# Patient Record
Sex: Male | Born: 1965 | Race: Black or African American | Hispanic: No | Marital: Married | Smoking: Never smoker
Health system: Southern US, Community
[De-identification: ages and names within clinical notes are randomized; demographics above are authoritative.]

## PROBLEM LIST (undated history)

## (undated) DIAGNOSIS — R7303 Prediabetes: Secondary | ICD-10-CM

## (undated) DIAGNOSIS — Z789 Other specified health status: Secondary | ICD-10-CM

## (undated) DIAGNOSIS — E785 Hyperlipidemia, unspecified: Secondary | ICD-10-CM

## (undated) HISTORY — PX: NO PAST SURGERIES: SHX2092

## (undated) HISTORY — DX: Prediabetes: R73.03

## (undated) HISTORY — DX: Hyperlipidemia, unspecified: E78.5

---

## 2005-08-25 ENCOUNTER — Ambulatory Visit: Payer: Self-pay | Admitting: General Practice

## 2011-10-03 ENCOUNTER — Ambulatory Visit: Payer: Self-pay | Admitting: General Practice

## 2011-11-01 ENCOUNTER — Ambulatory Visit: Payer: Self-pay | Admitting: General Practice

## 2014-09-23 ENCOUNTER — Ambulatory Visit: Payer: Self-pay | Admitting: General Practice

## 2015-12-29 ENCOUNTER — Emergency Department
Admission: EM | Admit: 2015-12-29 | Discharge: 2015-12-29 | Disposition: A | Discharge status: Home / Self Care | Payer: Worker's Compensation | Attending: Emergency Medicine | Admitting: Emergency Medicine

## 2015-12-29 ENCOUNTER — Encounter: Payer: Self-pay | Admitting: Emergency Medicine

## 2015-12-29 ENCOUNTER — Emergency Department: Payer: Worker's Compensation

## 2015-12-29 ENCOUNTER — Other Ambulatory Visit: Payer: Self-pay | Admitting: Physician Assistant

## 2015-12-29 DIAGNOSIS — Y92310 Basketball court as the place of occurrence of the external cause: Secondary | ICD-10-CM | POA: Diagnosis not present

## 2015-12-29 DIAGNOSIS — Y998 Other external cause status: Secondary | ICD-10-CM | POA: Insufficient documentation

## 2015-12-29 DIAGNOSIS — S8992XA Unspecified injury of left lower leg, initial encounter: Secondary | ICD-10-CM | POA: Diagnosis present

## 2015-12-29 DIAGNOSIS — M25462 Effusion, left knee: Secondary | ICD-10-CM | POA: Insufficient documentation

## 2015-12-29 DIAGNOSIS — S86812A Strain of other muscle(s) and tendon(s) at lower leg level, left leg, initial encounter: Secondary | ICD-10-CM | POA: Diagnosis not present

## 2015-12-29 DIAGNOSIS — Y9367 Activity, basketball: Secondary | ICD-10-CM | POA: Insufficient documentation

## 2015-12-29 DIAGNOSIS — W51XXXA Accidental striking against or bumped into by another person, initial encounter: Secondary | ICD-10-CM | POA: Insufficient documentation

## 2015-12-29 MED ORDER — IBUPROFEN 600 MG PO TABS: 600.0000 mg | ORAL_TABLET | Freq: Three times a day (TID) | ORAL | Status: DC | PRN

## 2015-12-29 MED ORDER — OXYCODONE-ACETAMINOPHEN 7.5-325 MG PO TABS: 1.0000 | ORAL_TABLET | ORAL | Status: AC | PRN

## 2015-12-29 NOTE — ED Provider Notes (Signed)
[facility] Emergency Department Provider Note     Time seen: ----------------------------------------- 1:30 PM on 12/29/2015 -----------------------------------------    I have reviewed the triage vital signs and the nursing notes.   HISTORY  Chief Complaint Knee Injury    HPI Mark Rubio is a 50 y.o. male who presents to ER after he was playing basketball and another player and he struck knees together. Patient presents with obvious deformity to the knee and is brought in by EMS not being able to walk. Patient denies any other complaints, received some pain medicine in route currently doesn't have any pain.   History reviewed. No pertinent past medical history.  There are no active problems to display for this patient.   History reviewed. No pertinent past surgical history.  Allergies Review of patient's allergies indicates no known allergies.  Social History Social History  Substance Use Topics  . Smoking status: Never Smoker   . Smokeless tobacco: None  . Alcohol Use: No    Review of Systems Constitutional: Negative for fever. Musculoskeletal: Positive for left knee pain Skin: Negative for rash. Neurological: Negative for headaches, focal weakness or numbness.  ____________________________________________   PHYSICAL EXAM:  VITAL SIGNS: ED Triage Vitals  Enc Vitals Group     BP 12/29/15 1259 141/75 mmHg     Pulse Rate 12/29/15 1259 89     Resp 12/29/15 1259 16     Temp 12/29/15 1258 98.8 F (37.1 C)     Temp Source 12/29/15 1258 Oral     SpO2 12/29/15 1259 98 %     Weight 12/29/15 1258 250 lb (113.399 kg)     Height 12/29/15 1258 6\' 5"  (1.956 m)     Head Cir --      Peak Flow --      Pain Score 12/29/15 1258 5     Pain Loc --      Pain Edu? --      Excl. in [place]? --     Constitutional: Alert and oriented. Well appearing and in no distress. Musculoskeletal: Obvious left knee effusion with tenderness mostly over the  patella. Patella seems to be midline Neurologic:  Normal speech and language. No gross focal neurologic deficits are appreciated. Unable to assess gait due to pain and swelling Skin:  Skin is warm, dry and intact. No rash noted. ____________________________________________  ED COURSE:  Pertinent labs & imaging results that were available during my care of the patient were reviewed by me and considered in my medical decision making (see chart for details). Patient is in no acute distress, will obtain left knee x-rays. ____________________________________________  RADIOLOGY Images were viewed by me  Left knee x-rays Reveal patellar tendon injury. Patella is displaced superiorly. ____________________________________________  FINAL ASSESSMENT AND PLAN  Patellar tendon injury  Plan: Patient with imaging as dictated above. Patient with patellar tendon injury, I have discussed with orthopedist on call, he's been placed in a knee immobilizer and crutches and will have an MRI as an outpatient. I have discharged with pain medicine and outpatient instructions.   Earleen Newport, MD   Earleen Newport, MD 12/29/15 [phone]

## 2015-12-29 NOTE — ED Notes (Signed)
Pt was playing basketball and him and another player ran into eachother. Pt with obvious deformity to left knee, strong distal pulse, CBG 94.

## 2015-12-29 NOTE — Discharge Instructions (Signed)
Patellar Tendon Tear or Disruption With Rehab A patellar tendon tear or disruption is a complete tear of the tendon below the kneecap. The patellar tendon attaches the thigh muscle (quadriceps) to the shinbone (tibia). These muscles are responsible for bending the knee and flexing the hip. A tear in the patellar tendon results in a disability to perform these actions. SYMPTOMS   A "pop" or tear felt in the knee or under the kneecap, at the time of injury.  Pain, tenderness, swelling, warmth, or redness over and around the patellar tendon.  Pain that gets worse when trying to forcefully straighten the knee or bend the knee.  Inability to straighten the knee when seated.  Crackling sound (crepitation) when the tendon is moved or touched.  Bruising (contusion) around the knee within 48 hours of injury.  Loss of firm fullness when pushing on the area where the tendon ruptured (a defect between the ends of the tendon where they separated from each other). CAUSES  The patellar tendon tears when a force is placed on it that is greater than it can handle. Common causes of injury include:  A stressful incident, such as with jumping, hurdling, or starting a sprint.  Direct hit (trauma) to the knee. RISK INCREASES WITH:  Sports that require sudden, explosive muscle contraction, such as those involving jumping or quick starts.  Running or contact sports.  Poor strength and flexibility.  Previous patellar tendon injury.  Untreated patellar tendinitis.  Corticosteroid injection into the patellar tendon. (Corticosteroid injections weaken tendons.) PREVENTION  Warm up and stretch properly before activity.  Allow for adequate recovery between workouts.  Maintain physical fitness:  Strength, flexibility, and endurance.  Cardiovascular fitness.  Protect the knee with taping, protective strapping, or elastic compression bandage during activity. PROGNOSIS  If treated properly, patellar  tendon tears usually heal, with a return to sports within 6 to 9 months after injury.  RELATED COMPLICATIONS   Weakness of the thigh (quadriceps) muscles, especially if the tear is left untreated.  Re-rupture of the tendon after treatment.  Prolonged disability.  Risks of surgery: infection, injury to nerves (numbness, weakness, or paralysis), bleeding, knee stiffness, knee weakness, pain when sitting for long periods, pain when getting up from a seated position and when kneeling or squatting, pain going up or down stairs or hills, and knee giving way or buckling. TREATMENT  Treatment first involves resting from any activities that aggravate the symptoms. The use of ice and medicine will help reduce pain and inflammation. Applying a compression bandage and elevating the knee above the level of the heart will also help reduce inflammation. Definitive treatment for patellar tendon tears is surgery, because contraction of the quadriceps tendon prevents healing of the tendon. Surgery often involves using stitches (sutures) to sew the ends of the tendon back together. Surgery is followed by restraint of the knee, to allow for healing. After restraint, it is important to perform strengthening and stretching exercises to help regain strength and a full range of motion. These exercises may be completed at home or with a therapist.  MEDICATION   If pain medicine is needed, nonsteroidal anti-inflammatory medicines (aspirin and ibuprofen), or other minor pain relievers (acetaminophen), are often advised.  Do not take pain medicine for 7 days before surgery.  Prescription pain relievers may be given, if your caregiver thinks they are needed. Use only as directed and only as much as you need. COLD THERAPY  Cold treatment (icing) should be applied for 10 to 15  minutes every 2 to 3 hours for inflammation and pain, and immediately after activity that aggravates your symptoms. Use ice packs or an ice  massage. SEEK MEDICAL CARE IF:  Pain increases, despite treatment.  Cast discomfort develops.  Any of the following occur after surgery: signs of infection, including fever, increased pain, swelling, redness, drainage of fluids, or bleeding in the affected area.  New, unexplained symptoms develop. (Drugs used in treatment may produce side effects.) EXERCISES RANGE OF MOTION (ROM) AND STRETCHING EXERCISES - Patellar Tendon Tear/Disruption These exercises may help you when beginning to rehabilitate your injury. Your symptoms may resolve with or without further involvement from your physician, physical therapist or athletic trainer. While completing these exercises, remember:   Restoring tissue flexibility helps normal motion to return to the joints. This allows healthier, less painful movement and activity.  An effective stretch should be held for at least 30 seconds.  A stretch should never be painful. You should only feel a gentle lengthening or release in the stretched tissue. RANGE OF MOTION - Knee Flexion and Extension, Active-Assisted  Sit on the edge of a table or chair with your thighs firmly supported. It may be helpful to place a folded towel under the end of your right / left thigh.  Flexion (bending): Place the ankle of your healthy leg on top of the other ankle. Use your healthy leg to gently bend your right / left knee until you feel a mild tension across the top of your knee.  Hold for __________ seconds.  Extension (straightening): Switch your ankles so your right / left leg is on top. Use your healthy leg to straighten your right / left knee until you feel a mild tension on the backside of your knee.  Hold for __________ seconds. Repeat __________ times. Complete this exercise __________ times per day. RANGE OF MOTION - Knee Flexion, Active  Lie on your back with both knees straight. (If this causes back discomfort, bend your healthy knee, placing your foot flat on the  floor.)  Slowly slide your heel back toward your buttocks until you feel a gentle stretch in the front of your knee or thigh.  Hold for __________ seconds. Slowly slide your heel back to the starting position. Repeat __________ times. Complete this exercise __________ times per day.  STRENGTHENING EXERCISES - Patellar Tendon Tear/Disruption  These exercises may help you when beginning to rehabilitate your injury. They may resolve your symptoms with or without further involvement from your physician, physical therapist or athletic trainer. While completing these exercises, remember:   Muscles can gain both the endurance and the strength needed for everyday activities through controlled exercises.  Complete these exercises as instructed by your physician, physical therapist or athletic trainer. Increase the resistance and repetitions only as guided by your caregiver. STRENGTH - Quadriceps, Isometrics  Lie on your back with your right / left leg extended and your opposite knee bent.  Gradually tense the muscles in the front of your right / left thigh. You should see either your kneecap slide up toward your hip or increased dimpling just above the knee. This motion will push the back of the knee down toward the floor, mat, or bed on which you are lying.  Hold the muscle as tight as you can without increasing your pain for __________ seconds.  Relax the muscles slowly and completely between each repetition. Repeat __________ times. Complete this exercise __________ times per day.  STRENGTH - Quadriceps, Straight Leg Raises Quality counts!  Watch for signs that the quadriceps muscle is working, to be sure you are strengthening the correct muscles and not "cheating" by substituting with healthier muscles.  Lay on your back with your right / left leg extended and your opposite knee bent.  Tense the muscles in the front of your right / left thigh. You should see either your kneecap slide up or  increased dimpling just above the knee. Your thigh may even shake a bit.  Tighten these muscles even more and raise your leg 4 to 6 inches off the floor. Hold for __________ seconds.  Keeping these muscles tense, lower your leg.  Relax the muscles slowly and completely between each repetition. Repeat __________ times. Complete this exercise __________ times per day.  STRENGTH - Hip Abductors, Straight Leg Raises  Be aware of your form throughout the entire exercise, so that you exercise the correct muscles Poor form means that you are not strengthening the correct muscles.  Lie on your side so that your head, shoulders, knee and hip line up. You may bend your lower knee to help maintain your balance. Your right / left leg should be on top.  Roll your hips slightly forward, so that your hips are stacked directly over each other and your right / left knee is facing forward.  Lift your top leg up 4-6 inches, leading with your heel. Be sure that your foot does not drift forward or that your knee does not roll toward the ceiling.  Hold this position for __________ seconds. You should feel the muscles in your outer hip lifting. (You may not notice this until your leg begins to tire.)  Slowly lower your leg to the starting position. Allow the muscles to fully relax before beginning the next repetition. Repeat __________ times. Complete this exercise __________ times per day.  STRENGTH - Hip Extensors, Straight Leg Raises  Lie on your stomach on a firm surface.  Tense the muscles in your buttocks to lift your right / left leg about 4 inches. If you cannot lift your leg this high without arching your back, place a pillow under your hips.  Keep your knee straight. Hold __________ seconds.  Slowly lower your leg to the starting position and allow it to relax completely before starting the next repetition. Repeat __________ times. Complete this exercise __________ times per day.  STRENGTH - Hip  Adductors, Straight Leg Raises  Lie on your side so that your head, shoulders, knee and hip line up. You may place your upper foot in front, to help maintain your balance. Your right / left leg should be on the bottom.  Roll your hips slightly forward, so that your hips are stacked directly over each other and your right / left knee is facing forward.  Tense the muscles in your inner thigh and lift your bottom leg 4-6 inches. Hold this position for __________ seconds.  Slowly lower your leg to the starting position. Allow the muscles to fully relax before beginning the next repetition. Repeat __________ times. Complete this exercise __________ times per day.   This information is not intended to replace advice given to you by your health care provider. Make sure you discuss any questions you have with your health care provider.   Document Released: 10/10/2005 Document Revised: 02/24/2015 Document Reviewed: 01/22/2009 Elsevier Interactive Patient Education [employer].

## 2015-12-30 ENCOUNTER — Ambulatory Visit: Payer: BLUE CROSS/BLUE SHIELD

## 2015-12-31 ENCOUNTER — Ambulatory Visit
Admission: RE | Admit: 2015-12-31 | Discharge: 2015-12-31 | Disposition: A | Discharge status: Home / Self Care | Payer: Worker's Compensation | Source: Ambulatory Visit | Attending: Physician Assistant | Admitting: Physician Assistant

## 2015-12-31 DIAGNOSIS — S76112A Strain of left quadriceps muscle, fascia and tendon, initial encounter: Secondary | ICD-10-CM | POA: Diagnosis not present

## 2015-12-31 DIAGNOSIS — S86812A Strain of other muscle(s) and tendon(s) at lower leg level, left leg, initial encounter: Secondary | ICD-10-CM | POA: Diagnosis not present

## 2016-01-01 ENCOUNTER — Ambulatory Visit: Payer: Worker's Compensation

## 2016-01-01 ENCOUNTER — Ambulatory Visit
Admission: RE | Admit: 2016-01-01 | Discharge: 2016-01-01 | Disposition: A | Discharge status: Home / Self Care | Payer: Worker's Compensation | Source: Ambulatory Visit | Attending: Orthopedic Surgery | Admitting: Orthopedic Surgery

## 2016-01-01 ENCOUNTER — Encounter: Payer: Self-pay | Admitting: *Deleted

## 2016-01-01 ENCOUNTER — Ambulatory Visit: Payer: Worker's Compensation | Admitting: Anesthesiology

## 2016-01-01 ENCOUNTER — Encounter
Admission: RE | Disposition: A | Discharge status: Home / Self Care | Payer: Self-pay | Source: Ambulatory Visit | Attending: Orthopedic Surgery

## 2016-01-01 DIAGNOSIS — X509XXA Other and unspecified overexertion or strenuous movements or postures, initial encounter: Secondary | ICD-10-CM | POA: Diagnosis not present

## 2016-01-01 DIAGNOSIS — E7800 Pure hypercholesterolemia, unspecified: Secondary | ICD-10-CM | POA: Diagnosis not present

## 2016-01-01 DIAGNOSIS — Z79899 Other long term (current) drug therapy: Secondary | ICD-10-CM | POA: Diagnosis not present

## 2016-01-01 DIAGNOSIS — Y9367 Activity, basketball: Secondary | ICD-10-CM | POA: Insufficient documentation

## 2016-01-01 DIAGNOSIS — Z419 Encounter for procedure for purposes other than remedying health state, unspecified: Secondary | ICD-10-CM

## 2016-01-01 DIAGNOSIS — S76112A Strain of left quadriceps muscle, fascia and tendon, initial encounter: Secondary | ICD-10-CM | POA: Insufficient documentation

## 2016-01-01 HISTORY — PX: PATELLAR TENDON REPAIR: SHX737

## 2016-01-01 HISTORY — DX: Other specified health status: Z78.9

## 2016-01-01 SURGERY — REPAIR, TENDON, PATELLAR
Anesthesia: General | Site: Knee | Laterality: Left | Wound class: Clean

## 2016-01-01 MED ORDER — ONDANSETRON HCL 4 MG/2ML IJ SOLN
INTRAMUSCULAR | Status: AC
  Filled 2016-01-01: qty 2

## 2016-01-01 MED ORDER — FENTANYL CITRATE (PF) 100 MCG/2ML IJ SOLN
INTRAMUSCULAR | Status: AC
  Filled 2016-01-01: qty 2

## 2016-01-01 MED ORDER — CEFAZOLIN SODIUM-DEXTROSE 2-3 GM-% IV SOLR: 2.0000 g | Freq: Once | INTRAVENOUS | Status: DC

## 2016-01-01 MED ORDER — MIDAZOLAM HCL 2 MG/2ML IJ SOLN
INTRAMUSCULAR | Status: DC | PRN
  Administered 2016-01-01: 2 mg via INTRAVENOUS

## 2016-01-01 MED ORDER — LACTATED RINGERS IV SOLN
INTRAVENOUS | Status: DC
  Administered 2016-01-01: 125 mL/h via INTRAVENOUS
  Administered 2016-01-01: 10:00:00 via INTRAVENOUS

## 2016-01-01 MED ORDER — NEOMYCIN-POLYMYXIN B GU 40-200000 IR SOLN
Status: DC | PRN
  Administered 2016-01-01: 2 mL

## 2016-01-01 MED ORDER — FAMOTIDINE 20 MG PO TABS
ORAL_TABLET | ORAL | Status: AC
  Administered 2016-01-01: 20 mg via ORAL
  Filled 2016-01-01: qty 1

## 2016-01-01 MED ORDER — CHLORHEXIDINE GLUCONATE 4 % EX LIQD: Freq: Once | CUTANEOUS | Status: DC

## 2016-01-01 MED ORDER — OXYCODONE HCL 5 MG PO TABS
5.0000 mg | ORAL_TABLET | Freq: Once | ORAL | Status: AC
  Administered 2016-01-01: 5 mg via ORAL

## 2016-01-01 MED ORDER — GLYCOPYRROLATE 0.2 MG/ML IJ SOLN
INTRAMUSCULAR | Status: DC | PRN
  Administered 2016-01-01: 0.2 mg via INTRAVENOUS

## 2016-01-01 MED ORDER — PROPOFOL 10 MG/ML IV BOLUS
INTRAVENOUS | Status: DC | PRN
  Administered 2016-01-01: 20 mg via INTRAVENOUS
  Administered 2016-01-01: 40 mg via INTRAVENOUS
  Administered 2016-01-01: 200 mg via INTRAVENOUS

## 2016-01-01 MED ORDER — EPHEDRINE SULFATE 50 MG/ML IJ SOLN
INTRAMUSCULAR | Status: DC | PRN
  Administered 2016-01-01: 10 mg via INTRAVENOUS

## 2016-01-01 MED ORDER — OXYCODONE HCL 5 MG PO TABS
ORAL_TABLET | ORAL | Status: AC
  Administered 2016-01-01: 5 mg via ORAL
  Filled 2016-01-01: qty 1

## 2016-01-01 MED ORDER — LIDOCAINE HCL (CARDIAC) 20 MG/ML IV SOLN
INTRAVENOUS | Status: DC | PRN
  Administered 2016-01-01: 80 mg via INTRAVENOUS

## 2016-01-01 MED ORDER — FAMOTIDINE 20 MG PO TABS
20.0000 mg | ORAL_TABLET | Freq: Once | ORAL | Status: AC
  Administered 2016-01-01: 20 mg via ORAL

## 2016-01-01 MED ORDER — OXYCODONE HCL 5 MG PO TABS
5.0000 mg | ORAL_TABLET | ORAL | Status: DC | PRN
Start: 2016-01-01 — End: 2019-05-21

## 2016-01-01 MED ORDER — BUPIVACAINE-EPINEPHRINE (PF) 0.25% -1:200000 IJ SOLN
INTRAMUSCULAR | Status: AC
  Filled 2016-01-01: qty 30

## 2016-01-01 MED ORDER — ONDANSETRON HCL 4 MG/2ML IJ SOLN
INTRAMUSCULAR | Status: DC | PRN
  Administered 2016-01-01: 4 mg via INTRAVENOUS

## 2016-01-01 MED ORDER — CEFAZOLIN SODIUM-DEXTROSE 2-3 GM-% IV SOLR
INTRAVENOUS | Status: AC
  Filled 2016-01-01: qty 50

## 2016-01-01 MED ORDER — ONDANSETRON HCL 4 MG/2ML IJ SOLN
4.0000 mg | Freq: Once | INTRAMUSCULAR | Status: AC | PRN
  Administered 2016-01-01: 4 mg via INTRAVENOUS

## 2016-01-01 MED ORDER — ACETAMINOPHEN 10 MG/ML IV SOLN
INTRAVENOUS | Status: AC
  Filled 2016-01-01: qty 100

## 2016-01-01 MED ORDER — NEOMYCIN-POLYMYXIN B GU 40-200000 IR SOLN
Status: AC
  Filled 2016-01-01: qty 20

## 2016-01-01 MED ORDER — FENTANYL CITRATE (PF) 100 MCG/2ML IJ SOLN
25.0000 ug | INTRAMUSCULAR | Status: AC | PRN
  Administered 2016-01-01 (×6): 25 ug via INTRAVENOUS

## 2016-01-01 MED ORDER — ACETAMINOPHEN 10 MG/ML IV SOLN
INTRAVENOUS | Status: DC | PRN
  Administered 2016-01-01: 1000 mg via INTRAVENOUS

## 2016-01-01 MED ORDER — FENTANYL CITRATE (PF) 100 MCG/2ML IJ SOLN
INTRAMUSCULAR | Status: DC | PRN
  Administered 2016-01-01 (×2): 25 ug via INTRAVENOUS
  Administered 2016-01-01 (×4): 50 ug via INTRAVENOUS

## 2016-01-01 MED ORDER — BUPIVACAINE-EPINEPHRINE (PF) 0.25% -1:200000 IJ SOLN
INTRAMUSCULAR | Status: DC | PRN
  Administered 2016-01-01: 15 mL via PERINEURAL

## 2016-01-01 SURGICAL SUPPLY — 50 items
ANCHOR SUT BIO SW 4.75X19.1 (Anchor) ×9 IMPLANT
BRACE KNEE POST OP SHORT (BRACE) ×3 IMPLANT
CANISTER SUCT 1200ML W/VALVE (MISCELLANEOUS) ×3 IMPLANT
COOLER POLAR GLACIER W/PUMP (MISCELLANEOUS) ×3 IMPLANT
DECANTER SPIKE VIAL GLASS SM (MISCELLANEOUS) ×3 IMPLANT
DRAPE C-ARM XRAY 36X54 (DRAPES) ×3 IMPLANT
DRAPE C-ARMOR (DRAPES) IMPLANT
DRSG DERMACEA 8X12 NADH (GAUZE/BANDAGES/DRESSINGS) ×3 IMPLANT
DRSG OPSITE POSTOP 4X12 (GAUZE/BANDAGES/DRESSINGS) ×3 IMPLANT
DURAPREP 26ML APPLICATOR (WOUND CARE) ×6 IMPLANT
ELECT CAUTERY BLADE 6.4 (BLADE) ×3 IMPLANT
ELECT REM PT RETURN 9FT ADLT (ELECTROSURGICAL) ×3
ELECTRODE REM PT RTRN 9FT ADLT (ELECTROSURGICAL) ×1 IMPLANT
FIBER TAPE 2MM (Orthopedic Implant) ×6 IMPLANT
GAUZE SPONGE 4X4 12PLY STRL (GAUZE/BANDAGES/DRESSINGS) ×3 IMPLANT
GLOVE BIO SURGEON STRL SZ8 (GLOVE) ×3 IMPLANT
GLOVE BIOGEL M STRL SZ7.5 (GLOVE) ×3 IMPLANT
GLOVE SURG XRAY 8.5 LX (GLOVE) ×3 IMPLANT
GOWN SPECIALTY ULTRA XL (MISCELLANEOUS) ×3 IMPLANT
GOWN STRL REUS W/ TWL LRG LVL3 (GOWN DISPOSABLE) ×2 IMPLANT
GOWN STRL REUS W/TWL LRG LVL3 (GOWN DISPOSABLE) ×4
HEMOVAC 400CC 10FR (MISCELLANEOUS) IMPLANT
IV CATH ANGIO 14GX3.25 ORG (MISCELLANEOUS) IMPLANT
MARKER SKIN DUAL TIP RULER LAB (MISCELLANEOUS) ×3 IMPLANT
NEEDLE HYPO 25X1 1.5 SAFETY (NEEDLE) ×3 IMPLANT
NEEDLE MAYO 6 CRC TAPER PT (NEEDLE) ×6 IMPLANT
NS IRRIG 500ML POUR BTL (IV SOLUTION) ×3 IMPLANT
PACK TOTAL KNEE (MISCELLANEOUS) ×3 IMPLANT
PAD WRAPON POLAR KNEE (MISCELLANEOUS) ×1 IMPLANT
SOL PREP PVP 2OZ (MISCELLANEOUS) ×3
SOLUTION PREP PVP 2OZ (MISCELLANEOUS) ×1 IMPLANT
STAPLER SKIN PROX 35W (STAPLE) IMPLANT
STOCKINETTE M/LG 89821 (MISCELLANEOUS) ×3 IMPLANT
STRAP SAFETY BODY (MISCELLANEOUS) ×3 IMPLANT
SUCTION FRAZIER HANDLE 10FR (MISCELLANEOUS) ×2
SUCTION TUBE FRAZIER 10FR DISP (MISCELLANEOUS) ×1 IMPLANT
SUT FIBERWIRE #5 38 CONV BLUE (SUTURE)
SUT ORTHOCORD W/MULTIPK NDL (SUTURE) IMPLANT
SUT STEEL 7 (SUTURE) IMPLANT
SUT VIC AB 0 CT1 27 (SUTURE)
SUT VIC AB 0 CT1 27XCR 8 STRN (SUTURE) IMPLANT
SUT VIC AB 0 CT1 36 (SUTURE) ×6 IMPLANT
SUT VIC AB 2-0 CT1 27 (SUTURE) ×2
SUT VIC AB 2-0 CT1 TAPERPNT 27 (SUTURE) ×1 IMPLANT
SUTURE FIBERWR #5 38 CONV BLUE (SUTURE) IMPLANT
SYR 30ML LL (SYRINGE) ×3 IMPLANT
SYRINGE 10CC LL (SYRINGE) ×3 IMPLANT
SYS INTERNAL BRACE KNEE (Miscellaneous) ×6 IMPLANT
SYSTEM INTERNAL BRACE KNEE (Miscellaneous) ×2 IMPLANT
WRAPON POLAR PAD KNEE (MISCELLANEOUS) ×3

## 2016-01-01 NOTE — Discharge Instructions (Signed)
°  Instructions after Knee Surgery   Latondra Gebhart P. Holley Bouche., M.D.     Dept. of [facility]  [address] [place], [place]  [address]   Phone: [phone]   Fax: [phone]   DIET:  Drink plenty of non-alcoholic fluids & begin a light diet.  Resume your normal diet the day after surgery.  ACTIVITY:   You may use crutches or a walker with TOUCHDOWN weight-bearing to the left leg as tolerated.  You may wean yourself off of the walker or crutches as tolerated.   Avoid strenuous activities or athletics for a minimum of 4-6 weeks.  Do not drive or operate any equipment until instructed.  WOUND CARE:   Place one to two pillows under the knee the first day or two when sitting or lying.   Continue to use the PolarCare cooling pad to reduce pain and swelling.  The incision in your knee are closed with staples. The staples will be removed in the office.  The bulky dressing will be removed when you return to the office. Do NOT use any ointments or creams on the incision.   Do not allow the bandage to get wet.  KEEP THE BRACE IN PLACE AND LOCKED IN EXTENSION (STRAIGHT) AT ALL TIMES. DO NOT TAKE THE BRACE OFF!  MEDICATIONS:  You may resume your regular medications.  Please take the pain medication as prescribed.  Do not take pain medication on an empty stomach.  Do not drive or drink alcoholic beverages when taking pain medications.  CALL THE OFFICE FOR:  Temperature above 101 degrees  Excessive bleeding or drainage on the dressing.  Excessive swelling, coldness, or paleness of the toes.  Persistent nausea and vomiting.  FOLLOW-UP:   You should have an appointment to return to the office in 7-10 days after surgery.

## 2016-01-01 NOTE — Anesthesia Procedure Notes (Signed)
Procedure Name: LMA Insertion Date/Time: 01/01/2016 9:45 AM Performed by: Allean Found Pre-anesthesia Checklist: Patient identified, Emergency Drugs available, Suction available, Patient being monitored and Timeout performed Patient Re-evaluated:Patient Re-evaluated prior to inductionOxygen Delivery Method: Circle system utilized Preoxygenation: Pre-oxygenation with 100% oxygen Intubation Type: IV induction Ventilation: Mask ventilation without difficulty LMA: LMA inserted LMA Size: 5.0 Number of attempts: 1 Placement Confirmation: positive ETCO2 and breath sounds checked- equal and bilateral Tube secured with: Tape Dental Injury: Teeth and Oropharynx as per pre-operative assessment

## 2016-01-01 NOTE — Anesthesia Postprocedure Evaluation (Signed)
Anesthesia Post Note  Patient: Jeptha Lamance P[place]ala  Procedure(s) Performed: Procedure(s) (LRB): PATELLA TENDON REPAIR (Left)  Patient location during evaluation: PACU Anesthesia Type: General Level of consciousness: awake and alert Pain management: pain level controlled Vital Signs Assessment: post-procedure vital signs reviewed and stable Respiratory status: spontaneous breathing, nonlabored ventilation, respiratory function stable and patient connected to nasal cannula oxygen Cardiovascular status: blood pressure returned to baseline and stable Postop Assessment: no signs of nausea or vomiting Anesthetic complications: no    Last Vitals:  Filed Vitals:   01/01/16 1459 01/01/16 1605  BP: 151/110 139/84  Pulse: 96 111  Temp: 35.8 C   Resp: 20 18    Last Pain:  Filed Vitals:   01/01/16 1607  PainSc: [place]

## 2016-01-01 NOTE — Op Note (Signed)
OPERATIVE NOTE  DATE OF SURGERY:  01/01/2016  PATIENT NAME:  ISSAIAH SEEMANN   DOB: 12/20/1965  MRN: [id number]  PRE-OPERATIVE DIAGNOSIS: Rupture of the left patellar tendon  POST-OPERATIVE DIAGNOSIS:  Same  PROCEDURE:  Primary repair of the left patellar tendon rupture  SURGEON:  Marciano Sequin. M.D.  ASSISTANT:  None  ANESTHESIA: general  ESTIMATED BLOOD LOSS: 25 mL  FLUIDS REPLACED: 700 mL of crystalloid  TOURNIQUET TIME: 120 minutes  DRAINS: None  IMPLANTS UTILIZED: Arthrex 4.75 mm BioComposite SwivelLock suture anchors 7  INDICATIONS FOR SURGERY: DAEVEON CAMILLI is a 50 y.o. year old male who sustained a rupture of the left patellar tendon while playing basketball on 12/29/2015. After discussion of the risks and benefits of surgical intervention, the patient expressed understanding of the risks benefits and agree with plans for primary repair of the patellar tendon rupture.   The risks, benefits, and alternatives were discussed at length including but not limited to the risks of infection, bleeding, nerve injury, stiffness, blood clots, the need for revision surgery, cardiopulmonary complications, among others, and they were willing to proceed.  PROCEDURE IN DETAIL: The patient was brought into the operating room and, after adequate general anesthesia was achieved, a tourniquet was placed on the patient's upper thigh. The patient's knee and leg were cleaned and prepped with alcohol and DuraPrep and draped in the usual sterile fashion. A "timeout" was performed as per usual protocol. The lower extremity was exsanguinated using an Esmarch, and the tourniquet was inflated to 300 mmHg. An anterior longitudinal incision was made and dissection was carried down to the retinaculum. A hemarthrosis was evacuated and the joint was irrigated with copious amounts of normal saline with antibiotic solution. The peritenon was incised and reflected. A full-thickness tear of the patellar tendon  was noted with retraction of the patella superiorly. The margins of the tear were debrided. Two Arthrex 4.75 mm BioComposite SwivelLock anchors were inserted just deep to the attachment of the patellar tendon at the inferior pole of the patella. Excellent purchase was achieved. The associated FiberTapes were passed distally into the inferior section of the patellar tendon with a modified locking suture performed. A third 4.75 BioComposite SwivelLock anchor was inserted distal to the patellar tendon insertion and the patellar tendon was reduced and the patella advanced to the appropriate position. The position of the patella was confirmed in the lateral plane using C-arm. The medial and lateral bundles were then secured using the SwivelLock suture anchor. Good maintenance of the reduction was noted. The medial and lateral retinaculum was then repaired using a combination of interrupted #0 Vicryl sutures. A running suture of #0 Vicryl was used to oversew the patellar tendon. Finally, 2 Arthrex InternalBrace augmentation sets were opened and 2 SwivelLock anchors inserted at a 45 angle into the patella just medial and lateral to the edge of the patellar tendon. Corresponding SwivelLock anchors were placed in the tibia medial and lateral to the tibial tuberosity after appropriate tensioning. The knee was placed through gentle range of motion with good maintenance of the repair. Tourniquet was deflated after total tourniquet time of 120 minutes. Hemostasis was achieved using electrocautery. The wound was irrigated with copious amounts of normal saline with antibiotic solution. The paratenon was repaired using a running suture of #0 Vicryl. Subcutaneous tissue was approximated layers using first #0 Vicryl followed #2-0 Vicryl. 0.25% Marcaine with epinephrine was injected along the incision site.The skin was closed with skin staples. A sterile dressing was  applied followed by application of a knee range of motion brace  which was locked in extension.  The patient tolerated the procedure well and was transported to the recovery room in stable condition.  Akashdeep Chuba P. Holley Bouche M.D.

## 2016-01-01 NOTE — Brief Op Note (Signed)
01/01/2016  1:49 PM  PATIENT:  Mark Rubio  50 y.o. male  PRE-OPERATIVE DIAGNOSIS:  PATELLA TENDON RUPTURE, left  POST-OPERATIVE DIAGNOSIS:  left patella tendon rupture  PROCEDURE:  Procedure(s): PATELLA TENDON REPAIR (Left)  SURGEON:  Surgeon(s) and Role:    * Dereck Leep, MD - Primary  ASSISTANTS: none   ANESTHESIA:   general  EBL:  Total I/O In: 700 [I.V.:700] Out: 25 [Blood:25]  BLOOD ADMINISTERED:none  DRAINS: none   LOCAL MEDICATIONS USED:  MARCAINE     SPECIMEN:  No Specimen  DISPOSITION OF SPECIMEN:  N/A  COUNTS:  YES  TOURNIQUET:   120 minutes  DICTATION: .Dragon Dictation  PLAN OF CARE: Discharge to home after PACU  PATIENT DISPOSITION:  PACU - hemodynamically stable.   Delay start of Pharmacological VTE agent (>24hrs) due to surgical blood loss or risk of bleeding: not applicable

## 2016-01-01 NOTE — H&P (Signed)
The patient has been re-examined, and the chart reviewed, and there have been no interval changes to the documented history and physical.    The risks, benefits, and alternatives have been discussed at length. The patient expressed understanding of the risks benefits and agreed with plans for surgical intervention.  James P. Hooten, Jr. M.D.    

## 2016-01-01 NOTE — Anesthesia Preprocedure Evaluation (Signed)
Anesthesia Evaluation  Patient identified by MRN, date of birth, ID band Patient awake    Reviewed: Allergy & Precautions, NPO status , Patient's Chart, lab work & pertinent test results, reviewed documented beta blocker date and time   Airway Mallampati: II  TM Distance: >3 FB     Dental  (+) Chipped   Pulmonary           Cardiovascular      Neuro/Psych    GI/Hepatic   Endo/Other    Renal/GU      Musculoskeletal   Abdominal   Peds  Hematology   Anesthesia Other Findings   Reproductive/Obstetrics                             Anesthesia Physical Anesthesia Plan  ASA: II  Anesthesia Plan: General   Post-op Pain Management:    Induction: Intravenous  Airway Management Planned: LMA  Additional Equipment:   Intra-op Plan:   Post-operative Plan:   Informed Consent: I have reviewed the patients History and Physical, chart, labs and discussed the procedure including the risks, benefits and alternatives for the proposed anesthesia with the patient or authorized representative who has indicated his/her understanding and acceptance.     Plan Discussed with: CRNA  Anesthesia Plan Comments:         Anesthesia Quick Evaluation  

## 2016-01-01 NOTE — Transfer of Care (Signed)
Immediate Anesthesia Transfer of Care Note  Patient: Mark Rubio  Procedure(s) Performed: Procedure(s): PATELLA TENDON REPAIR (Left)  Patient Location: PACU  Anesthesia Type:General  Level of Consciousness: sedated  Airway & Oxygen Therapy: Patient Spontanous Breathing and Patient connected to face mask oxygen  Post-op Assessment: Report given to RN and Post -op Vital signs reviewed and stable  Post vital signs: Reviewed and stable  Last Vitals:  Filed Vitals:   01/01/16 0736  BP: 142/93  Pulse: 81  Temp: 37 C  Resp: 1    Complications: No apparent anesthesia complications

## 2016-01-04 ENCOUNTER — Encounter: Payer: Self-pay | Admitting: Orthopedic Surgery

## 2016-02-14 DIAGNOSIS — S86812A Strain of other muscle(s) and tendon(s) at lower leg level, left leg, initial encounter: Secondary | ICD-10-CM | POA: Insufficient documentation

## 2016-02-23 ENCOUNTER — Ambulatory Visit: Payer: Worker's Compensation | Attending: Orthopedic Surgery | Admitting: Physical Therapy

## 2016-02-23 DIAGNOSIS — M25562 Pain in left knee: Secondary | ICD-10-CM | POA: Diagnosis not present

## 2016-02-23 DIAGNOSIS — R262 Difficulty in walking, not elsewhere classified: Secondary | ICD-10-CM

## 2016-02-23 NOTE — Patient Instructions (Signed)
All exercises provided were adapted from [email or web address]. Patient was provided a written handout with pictures as described. Any additional cues were manually entered in to handout and copied in to this document.  ANKLE PUMPS - AP  Bend your foot up and down at your ankle joint as shown.   HIP ABDUCTION - SIDELYING  While lying on your side, slowly raise up your top leg to the side. Keep your knee straight and maintain your toes pointed forward the entire time. Keep your leg in-line with your body.  The bottom leg can be bent to stabilize your body.  QUAD SET  Tighten your top thigh muscle as you attempt to press the back of your knee downward towards the table.

## 2016-02-24 NOTE — Therapy (Signed)
[facility] [address], [place], [address] Phone: [phone]   Fax:  [phone]  Physical Therapy Evaluation  Patient Details  Name: Mark Rubio MRN: [id number] Date of Birth: 04-21-1966 No Data Recorded  Encounter Date: 02/23/2016      PT End of Session - 02/23/16 1238    Visit Number 1   Number of Visits 25   Date for PT Re-Evaluation 05/31/16   PT Start Time 0903   PT Stop Time 0957   PT Time Calculation (min) 54 min   Activity Tolerance Patient tolerated treatment well   Behavior During Therapy [facility] for tasks assessed/performed      Past Medical History  Diagnosis Date  . Medical history non-contributory     Past Surgical History  Procedure Laterality Date  . No past surgeries    . Patellar tendon repair Left 01/01/2016    Procedure: PATELLA TENDON REPAIR;  Surgeon: Dereck Leep, MD;  Location: [facility] ORS;  Service: Orthopedics;  Laterality: Left;    There were no vitals filed for this visit.       Subjective Assessment - 02/23/16 1228    Subjective Patient reports on March 7th he collided knees with another basketball player and had instant pain in L knee. He went to ER and was provided with crutches and knee immobilizer. He went in for patellar tendon repair sx on March 10th. Since that time he has been in a lockout brace, initially 0-40 degrees, but now 0-50 degrees. He has been using crutches with prolonged bouts of NWBing on LLE during the last several weeks.    Pertinent History Per physician order can increase brace gradually by 10 degrees per week. Denies numbness/tingling in LLE.    Limitations Sitting;Lifting;Standing;Walking   Diagnostic tests MRI confirming patellar tendon tear.    Patient Stated Goals To return to ADLs and household/work related    Currently in Pain? Yes   Pain Score --  Patient reports as a toothache most of the time but prevents him from a full night's  sleep.   Pain Location Knee   Pain Orientation Left   Pain Descriptors / Indicators Aching;Operative site guarding   Pain Type Surgical pain   Pain Onset More than a month ago   Pain Frequency Constant   Aggravating Factors  Prolonged weight bearing, flexion   Pain Relieving Factors Non-weight bearing.             [facility] PT Assessment - 02/23/16 1246    Assessment   Medical Diagnosis --  L Patellar Tendon Repair    Referring Provider --  Dr. Marry Guan   Onset Date/Surgical Date 01/01/16   Precautions   Precautions Knee   Required Braces or Orthoses Other Brace/Splint   Other Brace/Splint --  Lockout brace currently 0-50   Restrictions   Weight Bearing Restrictions Yes   LLE Weight Bearing Weight bearing as tolerated   Other Position/Activity Restrictions --  Lockout brace and crutches currently while WBing   Balance Screen   Has the patient fallen in the past 6 months No   Has the patient had a decrease in activity level because of a fear of falling?  Yes   [place] residence   Living Arrangements Spouse/significant other   Available Help at [place] to enter   Entrance Stairs-Number of Steps 3-4   Entrance Stairs-Rails Can reach both  Prior Function   Level of Independence Independent   Vocation Full time employment   Vocation Requirements --  Works for [employer]   Overall Cognitive Status Within Functional Limits for tasks assessed   Observation/Other Assessments   Lower Extremity Functional Scale  24   Observation/Other Assessments-Edema    Edema --  Noted most in L ankle, some around L knee and through calf   Sensation   Light Touch Appears Intact   AROM   Left Knee Extension 8   Left Knee Flexion 40   Left Ankle Dorsiflexion 12   Left Ankle Plantar Flexion 42   Palpation   Palpation comment --  Minimal quad activation, significant atrophy   Ambulation/Gait    Gait Comments --  Initially NWBing on LLE/toe touch with crutches     TherEx Quad sets (0.5/5 on MMT) barely able to elicit a palpable quad set, PT provided over-pressure 3 sets x 5 repetitions with cuing for 5" holds  Sidelying hip abductions (with brace on) 3 sets x 10 repetitions (noted IR and valgus of LLE during ambulation)  Ankle pumps bilaterally to encourage full AROM and muscle pump for fluid noted in LLE.                       PT Education - 02/23/16 1234    Education provided Yes   Education Details Educated patient to WB through LLE as tolerated with crutches, HEP, use of E-stim in future sessions to address atrophy in L quad.    Person(s) Educated Patient   Methods Explanation;Demonstration;Tactile cues;Handout   Comprehension Returned demonstration;Verbalized understanding             PT Long Term Goals - 02/23/16 1256    PT LONG TERM GOAL #1   Title Patient will demonstrate full knee extension and at least 110 degrees of knee flexion to demonstrate improved ability to perform community mobility.    Time 12   Period Weeks   Status New   PT LONG TERM GOAL #2   Title Patient will ambulate at least 1000' with LRAD to complete 6MW test to demonstrate community mobility.    Time 12   Period Weeks   Status New   PT LONG TERM GOAL #3   Title Patient will demonstrate at least 4/5 L knee extensor strength to demonstrate improved safety with gait.    Baseline 1/5    Time 12   Period Weeks   Status New   PT LONG TERM GOAL #4   Title Patient will perform 5x sit to stand in < 13 seconds without use of UEs to demonstrate reduced falls risk with LRAD.    Time 12   Period Weeks   Status New   PT LONG TERM GOAL #5   Title Patient will report worst pain of no more than 1/10 to demonstrate improved tolerance for ADLs.    Baseline 2/10   Time 12   Period Weeks   Status New   Additional Long Term Goals   Additional Long Term Goals Yes   PT LONG TERM  GOAL #6   Title Patient will complete parallel squat to demonstrate ability to complete lifting mechanics for ADLs.    Time 12   Period Weeks   Status New               Plan - 02/23/16 1240    Clinical Impression Statement Patient is a  pleasant 50 y/o male that is roughly 7 weeks s/p patellar tendon repair that was quite active prior to surgery. He demonstrates poor gait mechanics, with reduced weightbearing on his LLE initially, able to increase with cuing for sequencing. He demonstrates significant atrophy of L quadricep and poor quad set, also noted to have sidelying hip abduction strength deficit. Patient will require extensive therapy services to return to active lifestyle given the impairments seen today.    Rehab Potential Good   Clinical Impairments Affecting Rehab Potential Good outlook, very active prior to surgery, though significant atrophy currently noted.    PT Frequency 2x / week   PT Duration 12 weeks   PT Treatment/Interventions Aquatic Therapy;Electrical Stimulation;Biofeedback;Cryotherapy;Balance training;Therapeutic exercise;Therapeutic activities;Manual techniques;Taping;Dry needling;Scar mobilization;Neuromuscular re-education;Gait training;Stair training;DME Instruction   PT Next Visit Plan E-stim/russian stim for quad atrophy, quad sets, sidelying hip  abductions, gait training, progress with protocol when received.    PT Home Exercise Plan See patient instructions.    Consulted and Agree with Plan of Care Patient      Patient will benefit from skilled therapeutic intervention in order to improve the following deficits and impairments:  Abnormal gait, Decreased balance, Decreased activity tolerance, Decreased knowledge of precautions, Decreased knowledge of use of DME, Pain, Decreased strength, Decreased mobility, Difficulty walking, Decreased range of motion, Decreased endurance  Visit Diagnosis: Pain in left knee - Plan: PT plan of care  cert/re-cert  Difficulty in walking, not elsewhere classified - Plan: PT plan of care cert/re-cert     Problem List There are no active problems to display for this patient.  Kerman Passey, PT, DPT    02/24/2016, 8:36 AM  [place] [facility] [address], [place], [address] Phone: [phone]   Fax:  [phone]  Name: Mark Rubio MRN: [id number] Date of Birth: 1966-08-16

## 2016-03-03 ENCOUNTER — Ambulatory Visit: Payer: Worker's Compensation | Attending: Orthopedic Surgery | Admitting: Physical Therapy

## 2016-03-03 DIAGNOSIS — M25562 Pain in left knee: Secondary | ICD-10-CM | POA: Diagnosis present

## 2016-03-03 DIAGNOSIS — R262 Difficulty in walking, not elsewhere classified: Secondary | ICD-10-CM

## 2016-03-03 NOTE — Therapy (Signed)
[place] [facility] [address], [place], [address] Phone: [phone]   Fax:  [phone]  Physical Therapy Treatment  Patient Details  Name: Mark Rubio MRN: [id number] Date of Birth: July 20, 1966 No Data Recorded  Encounter Date: 03/03/2016      PT End of Session - 03/03/16 1642    Visit Number 2   Number of Visits 25   Date for PT Re-Evaluation 05/31/16   PT Start Time [id number]   PT Stop Time 1125   PT Time Calculation (min) 40 min   Activity Tolerance Patient tolerated treatment well   Behavior During Therapy [facility] for tasks assessed/performed      Past Medical History  Diagnosis Date  . Medical history non-contributory     Past Surgical History  Procedure Laterality Date  . No past surgeries    . Patellar tendon repair Left 01/01/2016    Procedure: PATELLA TENDON REPAIR;  Surgeon: Dereck Leep, MD;  Location: [facility] ORS;  Service: Orthopedics;  Laterality: Left;    There were no vitals filed for this visit.      Subjective Assessment - 03/03/16 1141    Subjective Patient reports he has been increasing his ROM as instructed as well as completing HEP from evaluation. He brings in RW today, unclear of what physician's plan is for weaning off ADs. Improved quadricep setting today and activation, though continued to show significant atrophy in both quadricep and calf.    Pertinent History Per physician order can increase brace gradually by 10 degrees per week. Denies numbness/tingling in LLE.    Limitations Sitting;Lifting;Standing;Walking   Diagnostic tests MRI confirming patellar tendon tear.    Patient Stated Goals To return to ADLs and household/work related    Currently in Pain? No/denies   Pain Onset More than a month ago        Heel slides AAROM x 12 for 2 sets PT assisting through comfortable available ROM to not stress quad repair   [place] stim to L quad with quad sets 10:10 ratio for 11  minutes at 41 mA holding quad sets when electrical stimulation was applied.   Heel raises 3 sets x 15 with HHA (no pain reported, able to complete  Standing hip abductions 2 sets x 12 with open chain for LLE only. (no pain reported)  Gait training to equalize step lengths. With standard walker, required extensive cuing on having walker sufficiently anterior to his COM and equalizing step lengths as he had been taking decreased step length with RLE, thus emphasizing stance time on LLE.                           PT Education - 03/03/16 1641    Education provided Yes   Education Details Increased HEP, will discuss case with MD about progressing off of AD.    Person(s) Educated Patient   Methods Explanation;Demonstration;Handout   Comprehension Verbalized understanding;Returned demonstration             PT Long Term Goals - 02/23/16 1256    PT LONG TERM GOAL #1   Title Patient will demonstrate full knee extension and at least 110 degrees of knee flexion to demonstrate improved ability to perform community mobility.    Time 12   Period Weeks   Status New   PT LONG TERM GOAL #2   Title Patient will ambulate at least 1000' with LRAD to complete  6MW test to demonstrate community mobility.    Time 12   Period Weeks   Status New   PT LONG TERM GOAL #3   Title Patient will demonstrate at least 4/5 L knee extensor strength to demonstrate improved safety with gait.    Baseline 1/5    Time 12   Period Weeks   Status New   PT LONG TERM GOAL #4   Title Patient will perform 5x sit to stand in < 13 seconds without use of UEs to demonstrate reduced falls risk with LRAD.    Time 12   Period Weeks   Status New   PT LONG TERM GOAL #5   Title Patient will report worst pain of no more than 1/10 to demonstrate improved tolerance for ADLs.    Baseline 2/10   Time 12   Period Weeks   Status New   Additional Long Term Goals   Additional Long Term Goals Yes   PT LONG TERM  GOAL #6   Title Patient will complete parallel squat to demonstrate ability to complete lifting mechanics for ADLs.    Time 12   Period Weeks   Status New               Plan - 03/03/16 1642    Rehab Potential Good   Clinical Impairments Affecting Rehab Potential Good outlook, very active prior to surgery, though significant atrophy currently noted.    PT Frequency 2x / week   PT Duration 12 weeks   PT Treatment/Interventions Aquatic Therapy;Electrical Stimulation;Biofeedback;Cryotherapy;Balance training;Therapeutic exercise;Therapeutic activities;Manual techniques;Taping;Dry needling;Scar mobilization;Neuromuscular re-education;Gait training;Stair training;DME Instruction   PT Next Visit Plan E-stim/russian stim for quad atrophy, quad sets, sidelying hip  abductions, gait training, progress with protocol when received.    PT Home Exercise Plan See patient instructions.    Consulted and Agree with Plan of Care Patient      Patient will benefit from skilled therapeutic intervention in order to improve the following deficits and impairments:  Abnormal gait, Decreased balance, Decreased activity tolerance, Decreased knowledge of precautions, Decreased knowledge of use of DME, Pain, Decreased strength, Decreased mobility, Difficulty walking, Decreased range of motion, Decreased endurance  Visit Diagnosis: Pain in left knee  Difficulty in walking, not elsewhere classified     Problem List There are no active problems to display for this patient.  Kerman Passey, PT, DPT    03/03/2016, 4:43 PM  [place] [facility] [address] [place], [address] Phone: [phone]   Fax:  [phone]  Name: Mark Rubio MRN: [id number] Date of Birth: [birth year]

## 2016-03-07 ENCOUNTER — Ambulatory Visit: Payer: Worker's Compensation

## 2016-03-07 DIAGNOSIS — R262 Difficulty in walking, not elsewhere classified: Secondary | ICD-10-CM

## 2016-03-07 DIAGNOSIS — M25562 Pain in left knee: Secondary | ICD-10-CM

## 2016-03-07 NOTE — Therapy (Signed)
[place] [facility] [address], [place], [address] Phone: [phone]   Fax:  [phone]  Physical Therapy Treatment  Patient Details  Name: Mark Rubio MRN: [id number] Date of Birth: 1966-04-25 No Data Recorded  Encounter Date: 03/07/2016      PT End of Session - 03/07/16 1054    Visit Number 3   Number of Visits 25   Date for PT Re-Evaluation 05/31/16   PT Start Time [id number]   PT Stop Time 1117   PT Time Calculation (min) 34 min   Activity Tolerance Patient tolerated treatment well   Behavior During Therapy [facility] for tasks assessed/performed      Past Medical History  Diagnosis Date  . Medical history non-contributory     Past Surgical History  Procedure Laterality Date  . No past surgeries    . Patellar tendon repair Left 01/01/2016    Procedure: PATELLA TENDON REPAIR;  Surgeon: Dereck Leep, MD;  Location: [facility] ORS;  Service: Orthopedics;  Laterality: Left;    There were no vitals filed for this visit.      Subjective Assessment - 03/07/16 1053    Subjective Pt reports he is doing well on this date. He is performing HEP and states that he has been increasing his brace by 10 degrees every week as instructed. Pt would like to know how long he has to use the brace. He does not report any follow-up appointments with MD and states that primary therapist was going to call to clarify how long pt needed to remain in brace.    Pertinent History Per physician order can increase brace gradually by 10 degrees per week. Denies numbness/tingling in LLE.    Limitations Sitting;Lifting;Standing;Walking   Diagnostic tests MRI confirming patellar tendon tear.    Patient Stated Goals To return to ADLs and household/work related    Currently in Pain? No/denies        TREAtMENT  Electrical Stimulation [place] stim to L quad with quad sets 10 on/50 off ratio for 10 minutes at pt tolerable intensity. Pt in supine  position with towel roll under knee. Encouragement to perform active quad sets during on cycle. Monitored to ensure tolerance and adequate quad contraction. Cues provided for strong quad set. Tentanic quad contraction observed;  Ther-ex Heel slides AAROM x 10 for 3 sets PT assisting through comfortable available ROM to not stress quad repair  Quad sets with 5 second hold 2 x 10; Minimal pain reported, rest breaks provided  Manual Therapy Inferior, medial, and lateral patellar mobilizations grade III, 30 seconds/bout x 3 each direction, superior mobilizations avoided to protect patellar tendon repair L knee extension stretch with towel roll under knee 5 second hold 2 x 10;                         PT Education - 03/07/16 1054    Education provided Yes   Education Details Reinforced HEP and plan of care   Person(s) Educated Patient   Methods Explanation   Comprehension Verbalized understanding             PT Long Term Goals - 02/23/16 1256    PT LONG TERM GOAL #1   Title Patient will demonstrate full knee extension and at least 110 degrees of knee flexion to demonstrate improved ability to perform community mobility.    Time 12   Period Weeks   Status New  PT LONG TERM GOAL #2   Title Patient will ambulate at least 1000' with LRAD to complete 6MW test to demonstrate community mobility.    Time 12   Period Weeks   Status New   PT LONG TERM GOAL #3   Title Patient will demonstrate at least 4/5 L knee extensor strength to demonstrate improved safety with gait.    Baseline 1/5    Time 12   Period Weeks   Status New   PT LONG TERM GOAL #4   Title Patient will perform 5x sit to stand in < 13 seconds without use of UEs to demonstrate reduced falls risk with LRAD.    Time 12   Period Weeks   Status New   PT LONG TERM GOAL #5   Title Patient will report worst pain of no more than 1/10 to demonstrate improved tolerance for ADLs.    Baseline 2/10   Time 12    Period Weeks   Status New   Additional Long Term Goals   Additional Long Term Goals Yes   PT LONG TERM GOAL #6   Title Patient will complete parallel squat to demonstrate ability to complete lifting mechanics for ADLs.    Time 12   Period Weeks   Status New               Plan - 03/07/16 1055    Clinical Impression Statement Pt arrived late for his appointment resulting in decreased therapy on this date due to scheduling conflicts. He is able to tolerate [place] electrical stimulation to LLE with tetanic L quad contraction observed. Pt lacks PROM L knee extension as well as very poor quad recruitument. Improved at end of session with quad sets. Pt encouraged to continue HEP and follow-up as scheduled. Primary therapist to update pt regarding conversation with MD about length of time in brace   Rehab Potential Good   Clinical Impairments Affecting Rehab Potential Good outlook, very active prior to surgery, though significant atrophy currently noted.    PT Frequency 2x / week   PT Duration 12 weeks   PT Treatment/Interventions Aquatic Therapy;Electrical Stimulation;Biofeedback;Cryotherapy;Balance training;Therapeutic exercise;Therapeutic activities;Manual techniques;Taping;Dry needling;Scar mobilization;Neuromuscular re-education;Gait training;Stair training;DME Instruction   PT Next Visit Plan E-stim/russian stim for quad atrophy, quad sets, sidelying hip  abductions, gait training, progress with protocol when received.    PT Home Exercise Plan Continue as prescribed   Consulted and Agree with Plan of Care Patient      Patient will benefit from skilled therapeutic intervention in order to improve the following deficits and impairments:  Abnormal gait, Decreased balance, Decreased activity tolerance, Decreased knowledge of precautions, Decreased knowledge of use of DME, Pain, Decreased strength, Decreased mobility, Difficulty walking, Decreased range of motion, Decreased  endurance  Visit Diagnosis: Pain in left knee  Difficulty in walking, not elsewhere classified     Problem List There are no active problems to display for this patient.  Phillips Grout PT, DPT   Huprich,Jason 03/07/2016, 12:12 PM  [place] [facility] [address] [place], [address] Phone: [phone]   Fax:  [phone]  Name: Mark Rubio MRN: [id number] Date of Birth: [birth year]

## 2016-03-10 ENCOUNTER — Ambulatory Visit: Payer: Worker's Compensation | Admitting: Physical Therapy

## 2016-03-10 DIAGNOSIS — M25562 Pain in left knee: Secondary | ICD-10-CM

## 2016-03-10 DIAGNOSIS — R262 Difficulty in walking, not elsewhere classified: Secondary | ICD-10-CM

## 2016-03-10 NOTE — Therapy (Signed)
[facility] [address] [place], [address] Phone: [phone]   Fax:  [phone]  Physical Therapy Treatment  Patient Details  Name: Mark Rubio MRN: [id number] Date of Birth: Oct 26, 1965 No Data Recorded  Encounter Date: 03/10/2016      PT End of Session - 03/10/16 1244    Visit Number 4   Number of Visits 25   Date for PT Re-Evaluation 05/31/16   PT Start Time 1037   PT Stop Time 1115   PT Time Calculation (min) 38 min   Activity Tolerance Patient tolerated treatment well   Behavior During Therapy [facility] for tasks assessed/performed      Past Medical History  Diagnosis Date  . Medical history non-contributory     Past Surgical History  Procedure Laterality Date  . No past surgeries    . Patellar tendon repair Left 01/01/2016    Procedure: PATELLA TENDON REPAIR;  Surgeon: Dereck Leep, MD;  Location: [facility] ORS;  Service: Orthopedics;  Laterality: Left;    There were no vitals filed for this visit.      Subjective Assessment - 03/10/16 1050    Subjective Patient reports he has been completing his HEP 2-3x per day without complaints. He has not heard back from operating surgeon for follow up (or he does not recall his next follow up date). He reports no pain, some swelling in L ankle every now and again. he has been using SPC around the house comfortably.    Pertinent History Per physician order can increase brace gradually by 10 degrees per week. Denies numbness/tingling in LLE.    Limitations Sitting;Lifting;Standing;Walking   Diagnostic tests MRI confirming patellar tendon tear.    Patient Stated Goals To return to ADLs and household/work related    Currently in Pain? No/denies      E-stim for russian current for strengthening 42 mA for 10:10 cycle for 11 minutes with quad sets while electrodes in place.    Flexion/extension 3-71 degrees   Heel slides x 10 repetitions with overpressure  to increase flexion ROM. 3 sets with PT assistance.   Gait training with Standard walker folded in half as cane as PT did not have cane suitable in height for patient. Educated patient to complete with cane even to LLE rather than behind 2 bouts x 50' with fatigue noted.  Standing hip flexion, abduction, extension open chain only with brace locked into extension. 2 sets x 10 repetitions with bilateral HHA on Standard Walker (no pain)                            PT Education - 03/10/16 1244    Education provided Yes   Education Details Progress to full time use of SPC as tolerated without limping, progressed HEP.    Person(s) Educated Patient   Methods Explanation;Demonstration;Handout   Comprehension Verbalized understanding;Returned demonstration             PT Long Term Goals - 02/23/16 1256    PT LONG TERM GOAL #1   Title Patient will demonstrate full knee extension and at least 110 degrees of knee flexion to demonstrate improved ability to perform community mobility.    Time 12   Period Weeks   Status New   PT LONG TERM GOAL #2   Title Patient will ambulate at least 1000' with LRAD to complete 6MW test to demonstrate community mobility.  Time 12   Period Weeks   Status New   PT LONG TERM GOAL #3   Title Patient will demonstrate at least 4/5 L knee extensor strength to demonstrate improved safety with gait.    Baseline 1/5    Time 12   Period Weeks   Status New   PT LONG TERM GOAL #4   Title Patient will perform 5x sit to stand in < 13 seconds without use of UEs to demonstrate reduced falls risk with LRAD.    Time 12   Period Weeks   Status New   PT LONG TERM GOAL #5   Title Patient will report worst pain of no more than 1/10 to demonstrate improved tolerance for ADLs.    Baseline 2/10   Time 12   Period Weeks   Status New   Additional Long Term Goals   Additional Long Term Goals Yes   PT LONG TERM GOAL #6   Title Patient will complete  parallel squat to demonstrate ability to complete lifting mechanics for ADLs.    Time 12   Period Weeks   Status New               Plan - 03/10/16 1244    Clinical Impression Statement Patient arrived late to session, reports he has been using SPC intermittently at home and encouraged to bring in for next session. PT has attempted to contact orthopedic surgeon twice now for protocol with no return, will progress with [employer] protocol until otherwise instructed. Patient reports no pain, initially shows poor gait pattern with standard walker as cane, though with cuing no limp noted. Continues to demonstrate poor quad recruitment and significant atrophy.    Rehab Potential Good   Clinical Impairments Affecting Rehab Potential Good outlook, very active prior to surgery, though significant atrophy currently noted.    PT Frequency 2x / week   PT Duration 12 weeks   PT Treatment/Interventions Aquatic Therapy;Electrical Stimulation;Biofeedback;Cryotherapy;Balance training;Therapeutic exercise;Therapeutic activities;Manual techniques;Taping;Dry needling;Scar mobilization;Neuromuscular re-education;Gait training;Stair training;DME Instruction   PT Next Visit Plan E-stim/russian stim for quad atrophy, quad sets, sidelying hip  abductions, gait training, progress with protocol when received. Flexion ROM progressions.    PT Home Exercise Plan Hip extensions, abductions with brace locked in extension, heel slides.    Consulted and Agree with Plan of Care Patient      Patient will benefit from skilled therapeutic intervention in order to improve the following deficits and impairments:  Abnormal gait, Decreased balance, Decreased activity tolerance, Decreased knowledge of precautions, Decreased knowledge of use of DME, Pain, Decreased strength, Decreased mobility, Difficulty walking, Decreased range of motion, Decreased endurance  Visit Diagnosis: Pain in left  knee  Difficulty in walking, not elsewhere classified     Problem List There are no active problems to display for this patient.   Mark Rubio, PT, DPT    03/10/2016, 12:50 PM  [facility] [address], [place], [address] Phone: [phone]   Fax:  [phone]  Name: Mark Rubio MRN: [id number] Date of Birth: 10-Jun-1966

## 2016-03-14 ENCOUNTER — Ambulatory Visit: Payer: Worker's Compensation | Admitting: Physical Therapy

## 2016-03-14 DIAGNOSIS — M25562 Pain in left knee: Secondary | ICD-10-CM

## 2016-03-14 DIAGNOSIS — R262 Difficulty in walking, not elsewhere classified: Secondary | ICD-10-CM

## 2016-03-14 NOTE — Therapy (Signed)
[facility] [address] [place], [address] Phone: [phone]   Fax:  [phone]  Physical Therapy Treatment  Patient Details  Name: Mark Rubio MRN: [id number] Date of Birth: 06/18/1966 No Data Recorded  Encounter Date: 03/14/2016      PT End of Session - 03/14/16 0901    Visit Number 5   Number of Visits 25   Date for PT Re-Evaluation 05/31/16   PT Start Time 0845   PT Stop Time 0928   PT Time Calculation (min) 43 min   Activity Tolerance Patient tolerated treatment well   Behavior During Therapy [facility] for tasks assessed/performed      Past Medical History  Diagnosis Date  . Medical history non-contributory     Past Surgical History  Procedure Laterality Date  . No past surgeries    . Patellar tendon repair Left 01/01/2016    Procedure: PATELLA TENDON REPAIR;  Surgeon: Dereck Leep, MD;  Location: [facility] ORS;  Service: Orthopedics;  Laterality: Left;    There were no vitals filed for this visit.      Subjective Assessment - 03/14/16 0848    Subjective Patient reporting that he is having problems with L ankle swelling still, has had since the operation. Reports this has been up and down since the surgery. Reports he is doing well with his HEP.    Pertinent History Per physician order can increase brace gradually by 10 degrees per week. Denies numbness/tingling in LLE.    Limitations Sitting;Lifting;Standing;Walking   Diagnostic tests MRI confirming patellar tendon tear.    Patient Stated Goals To return to ADLs and household/work related    Currently in Pain? No/denies      Gait with SPC, adjusted x 2 to prevent limping (cued patient to keep shoulders erect, WB more symmetrically by shifting hips to neutral, had appeared to be shifting his hips/weight to RLE). Patient cued to increase gait speed to normalize stride length as R sided stride < L sided stride initially.   Heel slides x 12  repetitions for 2 sets with PT assistance, reported sensation of stiffness/tightness around knee.   ROM 2-73 degrees   Pateller mobilizations x 30" for 3 bouts medial/lateral to grade II (noted some pitting edema, particularly laterally with very mild reports of discomfort).   Mini squats to 5-10 degrees of knee flexion with HHA and brace on x 10 repetitions (no pain -- felt good), progressed to 10-20 degrees on 2nd set (shifting weight to R, cuing to maintain even weight between LEs which he improved with).   Rocking back to initiate 0 degree(s) isometric contraction of L quadricep x 12 with HHA  Standing hip abduction x 12 with two finger support bilaterally (LLE open chain only) progressed to 1 finger assist for 2nd set of 12   Standing hip extension with 1 finger assist bilaterally (LLE open chain only) x 12 (for 2 sets)                           PT Education - 03/14/16 1419    Education provided Yes   Education Details Patient provided with list of questions/timeframe for progression with therapy to ask MD during appointment tomorrow.    Person(s) Educated Patient   Methods Explanation;Demonstration   Comprehension Verbalized understanding;Returned demonstration             PT Long Term Goals - 02/23/16 1256  PT LONG TERM GOAL #1   Title Patient will demonstrate full knee extension and at least 110 degrees of knee flexion to demonstrate improved ability to perform community mobility.    Time 12   Period Weeks   Status New   PT LONG TERM GOAL #2   Title Patient will ambulate at least 1000' with LRAD to complete 6MW test to demonstrate community mobility.    Time 12   Period Weeks   Status New   PT LONG TERM GOAL #3   Title Patient will demonstrate at least 4/5 L knee extensor strength to demonstrate improved safety with gait.    Baseline 1/5    Time 12   Period Weeks   Status New   PT LONG TERM GOAL #4   Title Patient will perform 5x sit to  stand in < 13 seconds without use of UEs to demonstrate reduced falls risk with LRAD.    Time 12   Period Weeks   Status New   PT LONG TERM GOAL #5   Title Patient will report worst pain of no more than 1/10 to demonstrate improved tolerance for ADLs.    Baseline 2/10   Time 12   Period Weeks   Status New   Additional Long Term Goals   Additional Long Term Goals Yes   PT LONG TERM GOAL #6   Title Patient will complete parallel squat to demonstrate ability to complete lifting mechanics for ADLs.    Time 12   Period Weeks   Status New               Plan - 03/14/16 0901    Clinical Impression Statement Patient demonstrating slight limp/LLE weakness with SPC, after adjustments and cuing for maintaining erect posture he was able to show improved gait pattern. He reports no pain with ambulation, though mild pain with patellar mobilizations (performed lightly). Patient is able to complete closed chain quad activation (in gentle maneuvers). Patient given questions for MD as this PT has made multiple attempts to contact for protocol/progression without being able to get in touch with MD.    Rehab Potential Good   Clinical Impairments Affecting Rehab Potential Good outlook, very active prior to surgery, though significant atrophy currently noted.    PT Frequency 2x / week   PT Duration 12 weeks   PT Treatment/Interventions Aquatic Therapy;Electrical Stimulation;Biofeedback;Cryotherapy;Balance training;Therapeutic exercise;Therapeutic activities;Manual techniques;Taping;Dry needling;Scar mobilization;Neuromuscular re-education;Gait training;Stair training;DME Instruction   PT Next Visit Plan E-stim/russian stim for quad atrophy, quad sets, sidelying hip  abductions, gait training, progress with protocol when received. Flexion ROM progressions.    PT Home Exercise Plan Hip extensions, abductions with brace locked in extension, heel slides.    Consulted and Agree with Plan of Care Patient       Patient will benefit from skilled therapeutic intervention in order to improve the following deficits and impairments:  Abnormal gait, Decreased balance, Decreased activity tolerance, Decreased knowledge of precautions, Decreased knowledge of use of DME, Pain, Decreased strength, Decreased mobility, Difficulty walking, Decreased range of motion, Decreased endurance  Visit Diagnosis: Pain in left knee  Difficulty in walking, not elsewhere classified     Problem List There are no active problems to display for this patient.  Kerman Passey, PT, DPT    03/14/2016, 2:23 PM  [facility] [address] [place], [address] Phone: [phone]   Fax:  [phone]  Name: Mark Rubio MRN:  [id number] Date of Birth: [birth year]

## 2016-03-16 ENCOUNTER — Ambulatory Visit: Payer: Worker's Compensation

## 2016-03-16 DIAGNOSIS — M25562 Pain in left knee: Secondary | ICD-10-CM

## 2016-03-16 DIAGNOSIS — R262 Difficulty in walking, not elsewhere classified: Secondary | ICD-10-CM

## 2016-03-16 NOTE — Therapy (Signed)
[facility] [address] [place], [address] Phone: [phone]   Fax:  [phone]  Physical Therapy Treatment  Patient Details  Name: Mark Rubio MRN: [id number] Date of Birth: 03/06/1966 No Data Recorded  Encounter Date: 03/16/2016      PT End of Session - 03/16/16 1123    Visit Number 6   Number of Visits 25   Date for PT Re-Evaluation 05/31/16   PT Start Time 1123   PT Stop Time 1207   PT Time Calculation (min) 44 min   Activity Tolerance Patient tolerated treatment well   Behavior During Therapy [facility] for tasks assessed/performed      Past Medical History  Diagnosis Date  . Medical history non-contributory     Past Surgical History  Procedure Laterality Date  . No past surgeries    . Patellar tendon repair Left 01/01/2016    Procedure: PATELLA TENDON REPAIR;  Surgeon: Dereck Leep, MD;  Location: [facility] ORS;  Service: Orthopedics;  Laterality: Left;    There were no vitals filed for this visit.      Subjective Assessment - 03/16/16 1125    Subjective Pt states he tends to tilt on his R side because of L ankle swelling and stiffness and takes time to get going. No L knee pain.   Pertinent History Per physician order can increase brace gradually by 10 degrees per week. Denies numbness/tingling in LLE.    Limitations Sitting;Lifting;Standing;Walking   Diagnostic tests MRI confirming patellar tendon tear.    Patient Stated Goals To return to ADLs and household/work related    Currently in Pain? No/denies   Pain Score 0-No pain       Objectives  60 degrees L knee flexion at start of session    There-ex  Directed patient with gait brace on with SPC x 200 ft, cues to decrease R LE weight shifting, R lateral lean and to promote more upright posture.   supine heel slides 10x3 with PT assist   67 degrees L knee flexion  Seated heel slides with PT assist 10x2  75 degrees seated knee  flexion with PT assist in seated heel slide position  Standing hip abduction 2x 12 with two finger support bilaterally (LLE open chain only), brace on  Standing L hip extension 2x12 with bilateral UE assist (LLE open chain only), brace on  Mini squats to 5-10 degrees of knee flexion with finger touch assist and brace on x 10 repetitions   Improved exercise technique, movement at target joints, use of target muscles after mod verbal, visual, tactile cues.     Manual therapy  STM L quad  62 degrees L knee flexion (heel slide afterwards)     Pt demonstrates stiffness and swelling L knee, limiting ROM. L knee flexion ROM improved to 75 degrees after performing seated heel slides. Pt also demonstrates tendency for R weight shifting and lateral lean when walking, probably due to L ankle discomfort and L LE weakness.                          PT Education - 03/16/16 1209    Education provided Yes   Education Details ther-ex, gait with decreased R lateral lean   Person(s) Educated Patient   Methods Explanation;Demonstration;Tactile cues;Verbal cues   Comprehension Returned demonstration;Verbalized understanding             PT Long Term Goals -  02/23/16 1256    PT LONG TERM GOAL #1   Title Patient will demonstrate full knee extension and at least 110 degrees of knee flexion to demonstrate improved ability to perform community mobility.    Time 12   Period Weeks   Status New   PT LONG TERM GOAL #2   Title Patient will ambulate at least 1000' with LRAD to complete 6MW test to demonstrate community mobility.    Time 12   Period Weeks   Status New   PT LONG TERM GOAL #3   Title Patient will demonstrate at least 4/5 L knee extensor strength to demonstrate improved safety with gait.    Baseline 1/5    Time 12   Period Weeks   Status New   PT LONG TERM GOAL #4   Title Patient will perform 5x sit to stand in < 13 seconds without use of UEs to demonstrate  reduced falls risk with LRAD.    Time 12   Period Weeks   Status New   PT LONG TERM GOAL #5   Title Patient will report worst pain of no more than 1/10 to demonstrate improved tolerance for ADLs.    Baseline 2/10   Time 12   Period Weeks   Status New   Additional Long Term Goals   Additional Long Term Goals Yes   PT LONG TERM GOAL #6   Title Patient will complete parallel squat to demonstrate ability to complete lifting mechanics for ADLs.    Time 12   Period Weeks   Status New               Plan - 03/16/16 1210    Clinical Impression Statement Pt demonstrates stiffness and swelling L knee, limiting ROM. L knee flexion ROM improved to 75 degrees after performing seated heel slides. Pt also demonstrates tendency for R weight shifting and lateral lean when walking, probably due to L ankle discomfort and L LE weakness.     Rehab Potential Good   Clinical Impairments Affecting Rehab Potential Good outlook, very active prior to surgery, though significant atrophy currently noted.    PT Frequency 2x / week   PT Duration 12 weeks   PT Treatment/Interventions Aquatic Therapy;Electrical Stimulation;Biofeedback;Cryotherapy;Balance training;Therapeutic exercise;Therapeutic activities;Manual techniques;Taping;Dry needling;Scar mobilization;Neuromuscular re-education;Gait training;Stair training;DME Instruction   PT Next Visit Plan E-stim/russian stim for quad atrophy, quad sets, sidelying hip  abductions, gait training, progress with protocol when received. Flexion ROM progressions.    PT Home Exercise Plan Hip extensions, abductions with brace locked in extension, heel slides.    Consulted and Agree with Plan of Care Patient      Patient will benefit from skilled therapeutic intervention in order to improve the following deficits and impairments:  Abnormal gait, Decreased balance, Decreased activity tolerance, Decreased knowledge of precautions, Decreased knowledge of use of DME, Pain,  Decreased strength, Decreased mobility, Difficulty walking, Decreased range of motion, Decreased endurance  Visit Diagnosis: Pain in left knee  Difficulty in walking, not elsewhere classified     Problem List There are no active problems to display for this patient.   Joneen Boers PT, DPT   03/16/2016, 12:18 PM  [facility] [address], [place], [address] Phone: [phone]   Fax:  [phone]  Name: Mark Rubio MRN: [id number] Date of Birth: [birth year]

## 2016-03-17 ENCOUNTER — Ambulatory Visit: Payer: Worker's Compensation

## 2016-03-17 DIAGNOSIS — R262 Difficulty in walking, not elsewhere classified: Secondary | ICD-10-CM

## 2016-03-17 DIAGNOSIS — M25562 Pain in left knee: Secondary | ICD-10-CM

## 2016-03-17 NOTE — Therapy (Signed)
[place] [facility] [address], [place], [address] Phone: [phone]   Fax:  [phone]  Physical Therapy Treatment  Patient Details  Name: Mark Rubio MRN: [id number] Date of Birth: 03-15-1966 No Data Recorded  Encounter Date: 03/17/2016      PT End of Session - 03/17/16 1432    Visit Number 7   Number of Visits 25   Date for PT Re-Evaluation 05/31/16   PT Start Time 1433   PT Stop Time 1517   PT Time Calculation (min) 44 min   Activity Tolerance Patient tolerated treatment well   Behavior During Therapy [facility] for tasks assessed/performed      Past Medical History  Diagnosis Date  . Medical history non-contributory     Past Surgical History  Procedure Laterality Date  . No past surgeries    . Patellar tendon repair Left 01/01/2016    Procedure: PATELLA TENDON REPAIR;  Surgeon: Dereck Leep, MD;  Location: [facility] ORS;  Service: Orthopedics;  Laterality: Left;    There were no vitals filed for this visit.      Subjective Assessment - 03/17/16 1435    Subjective No knee pain. 5/10 L ankle discomfort from the inflammation.  Has also been practicing walking without the cane a little bit.    Pertinent History Per physician order can increase brace gradually by 10 degrees per week. Denies numbness/tingling in LLE.    Limitations Sitting;Lifting;Standing;Walking   Diagnostic tests MRI confirming patellar tendon tear.    Patient Stated Goals To return to ADLs and household/work related    Currently in Pain? Yes   Pain Score 5   L ankle          Objectives  65 degrees L knee flexion at start of session    There-ex  Directed patient with seated heel slides with PT assist 10x2 80 degrees seated knee flexion with PT assist in seated heel slide position  Mini squats to 5-10 degrees of knee flexion with finger touch assist and brace on 2 x 10 repetitions  Standing heel toe raises to  promote knee extension 10x3  gait with brace on with SPC x 100 ft, cues to decrease R LE weight shifting, R lateral lean and to promote more upright posture  Seated ankle DF/PF on rocker board to help decrease swelling x 2 min   Improved exercise technique, movement at target joints, use of target muscles after mod verbal, visual, tactile cues.      E-stim for russian current for strengthening 47 mA for 10:10 cycle for 15 minutes with quad sets (during 10 seconds on) while electrodes in place.                             PT Education - 03/17/16 1449    Education provided Yes   Education Details ther-ex, knee flexion ROM   Person(s) Educated Patient   Methods Explanation;Demonstration;Tactile cues;Verbal cues   Comprehension Verbalized understanding;Returned demonstration             PT Long Term Goals - 02/23/16 1256    PT LONG TERM GOAL #1   Title Patient will demonstrate full knee extension and at least 110 degrees of knee flexion to demonstrate improved ability to perform community mobility.    Time 12   Period Weeks   Status New   PT LONG TERM GOAL #2   Title Patient  will ambulate at least 1000' with LRAD to complete 6MW test to demonstrate community mobility.    Time 12   Period Weeks   Status New   PT LONG TERM GOAL #3   Title Patient will demonstrate at least 4/5 L knee extensor strength to demonstrate improved safety with gait.    Baseline 1/5    Time 12   Period Weeks   Status New   PT LONG TERM GOAL #4   Title Patient will perform 5x sit to stand in < 13 seconds without use of UEs to demonstrate reduced falls risk with LRAD.    Time 12   Period Weeks   Status New   PT LONG TERM GOAL #5   Title Patient will report worst pain of no more than 1/10 to demonstrate improved tolerance for ADLs.    Baseline 2/10   Time 12   Period Weeks   Status New   Additional Long Term Goals   Additional Long Term Goals Yes   PT LONG TERM GOAL #6    Title Patient will complete parallel squat to demonstrate ability to complete lifting mechanics for ADLs.    Time 12   Period Weeks   Status New               Plan - 03/17/16 1449    Clinical Impression Statement Improved L knee flexion ROM in sitting to 80 degrees. Utilized [place] E-stim to L VMO muscle secondary to atrophy. Some activation of L VMO muscle observed with E-stim.    Rehab Potential Good   Clinical Impairments Affecting Rehab Potential Good outlook, very active prior to surgery, though significant atrophy currently noted.    PT Frequency 2x / week   PT Duration 12 weeks   PT Treatment/Interventions Aquatic Therapy;Electrical Stimulation;Biofeedback;Cryotherapy;Balance training;Therapeutic exercise;Therapeutic activities;Manual techniques;Taping;Dry needling;Scar mobilization;Neuromuscular re-education;Gait training;Stair training;DME Instruction   PT Next Visit Plan E-stim/russian stim for quad atrophy, quad sets, sidelying hip  abductions, gait training, progress with protocol when received. Flexion ROM progressions.    PT Home Exercise Plan Hip extensions, abductions with brace locked in extension, heel slides.    Consulted and Agree with Plan of Care Patient      Patient will benefit from skilled therapeutic intervention in order to improve the following deficits and impairments:  Abnormal gait, Decreased balance, Decreased activity tolerance, Decreased knowledge of precautions, Decreased knowledge of use of DME, Pain, Decreased strength, Decreased mobility, Difficulty walking, Decreased range of motion, Decreased endurance  Visit Diagnosis: Pain in left knee  Difficulty in walking, not elsewhere classified     Problem List There are no active problems to display for this patient.   Joneen Boers PT, DPT   03/17/2016, 7:54 PM  [place] [facility] [address] [place], [address] Phone:  [phone]   Fax:  [phone]  Name: Mark Rubio MRN: [id number] Date of Birth: 1966/03/02

## 2016-03-23 ENCOUNTER — Ambulatory Visit: Payer: Worker's Compensation

## 2016-03-23 DIAGNOSIS — R262 Difficulty in walking, not elsewhere classified: Secondary | ICD-10-CM

## 2016-03-23 DIAGNOSIS — M25562 Pain in left knee: Secondary | ICD-10-CM | POA: Diagnosis not present

## 2016-03-23 NOTE — Therapy (Signed)
[place] [facility] [address], [place], [address] Phone: [phone]   Fax:  [phone]  Physical Therapy Treatment  Patient Details  Name: Mark Rubio MRN: [id number] Date of Birth: [birth year] No Data Recorded  Encounter Date: 03/23/2016      PT End of Session - 03/23/16 1111    Visit Number 8   Number of Visits 25   Date for PT Re-Evaluation 05/31/16   PT Start Time 1111   PT Stop Time 1206   PT Time Calculation (min) 55 min   Activity Tolerance Patient tolerated treatment well   Behavior During Therapy [facility] for tasks assessed/performed      Past Medical History  Diagnosis Date  . Medical history non-contributory     Past Surgical History  Procedure Laterality Date  . No past surgeries    . Patellar tendon repair Left 01/01/2016    Procedure: PATELLA TENDON REPAIR;  Surgeon: Dereck Leep, MD;  Location: [facility] ORS;  Service: Orthopedics;  Laterality: Left;    There were no vitals filed for this visit.      Subjective Assessment - 03/23/16 1113    Subjective No pain L knee. 5/10 L ankle currently. Pt adds that his ankle pain is laterally located.    Pertinent History Per physician order can increase brace gradually by 10 degrees per week. Denies numbness/tingling in LLE.    Limitations Sitting;Lifting;Standing;Walking   Diagnostic tests MRI confirming patellar tendon tear.    Patient Stated Goals To return to ADLs and household/work related    Currently in Pain? Yes   Pain Score 5   5/10 L ankle, 0/10 L knee            Objectives  E-stim for russian current for strengthening 49 mA for 10:10 cycle for 15 minutes with quad sets (during 10 seconds on) while electrodes in place.   VMO muscle activation/muscle twitch observed.   Manual therapy  Effleurage L leg in supine, leg elevated to help decrease swelling    There-ex  Directed patient with seated heel slides with PT assist  10x3, cues for gentle movement  Seated knee flexion ROM improved from 73 degrees to 80 degrees Supine SLR L hip flexion with brace locked in extension and strap assist 10x2  Pt was recommended to start 2x10 at home, working towards 3x10 pain free range for his HEP. Pt demonstrated and verbalized understanding.   Walking 300 ft with SPC on R, knee brace on, cues for increasing push-off and knee flexion (during swing phase).   Pt was recommended to use arch supports (try the ones he has at home first) secondary to lateral ankle pain and pronated foot posture to try to decrease lateral ankle pressure in closed chain positions. Pt verbalized understanding.    Improved exercise technique, movement at target joints, use of target muscles after min to mod verbal, visual, tactile cues.     Improved L foot push-off, and L knee flexion (during swing phase) as well as decreased R lateral lean during L LE heel strike, foot flat, to push-off phases while walking after cues. Slight decrease in L ankle swelling after effleurage. L VMO muscle activation observed during E-stim. Quad lag observed during L LE SLR L hip flexion with knee brace locked in extension and strap assist, and patient was recommended to perform exercise at home as well to promote L quadriceps strength in preparation for independent ambulation. Pt tolerated session well  without complain of L knee pain. L lateral ankle discomfort felt after walking a 3rd lap around the gym which decreased with a sitting rest break. Pt L knee flexion ROM improved to 80 degrees after seated heel slide exercise.                          PT Education - 03/23/16 1120    Education provided Yes   Education Details ther-ex   [employer]) Educated Patient   Methods Explanation;Demonstration;Tactile cues;Verbal cues   Comprehension Verbalized understanding;Returned demonstration             PT Long Term Goals - 02/23/16 1256    PT LONG TERM  GOAL #1   Title Patient will demonstrate full knee extension and at least 110 degrees of knee flexion to demonstrate improved ability to perform community mobility.    Time 12   Period Weeks   Status New   PT LONG TERM GOAL #2   Title Patient will ambulate at least 1000' with LRAD to complete 6MW test to demonstrate community mobility.    Time 12   Period Weeks   Status New   PT LONG TERM GOAL #3   Title Patient will demonstrate at least 4/5 L knee extensor strength to demonstrate improved safety with gait.    Baseline 1/5    Time 12   Period Weeks   Status New   PT LONG TERM GOAL #4   Title Patient will perform 5x sit to stand in < 13 seconds without use of UEs to demonstrate reduced falls risk with LRAD.    Time 12   Period Weeks   Status New   PT LONG TERM GOAL #5   Title Patient will report worst pain of no more than 1/10 to demonstrate improved tolerance for ADLs.    Baseline 2/10   Time 12   Period Weeks   Status New   Additional Long Term Goals   Additional Long Term Goals Yes   PT LONG TERM GOAL #6   Title Patient will complete parallel squat to demonstrate ability to complete lifting mechanics for ADLs.    Time 12   Period Weeks   Status New               Plan - 03/23/16 1120    Clinical Impression Statement Improved L foot push-off, and L knee flexion (during swing phase) as well as decreased R lateral lean during L LE heel strike, foot flat, to push-off phases while walking after cues. Slight decrease in L ankle swelling after effleurage. L VMO muscle activation observed during E-stim. Quad lag observed during L LE SLR L hip flexion with knee brace locked in extension and strap assist, and patient was recommended to perform exercise at home as well to promote L quadriceps strength in preparation for independent ambulation. Pt tolerated session well without complain of L knee pain. L lateral ankle discomfort felt after walking a 3rd lap around the gym which  decreased with a sitting rest break. Pt L knee flexion ROM improved to 80 degrees after seated heel slide exercise.    Rehab Potential Good   Clinical Impairments Affecting Rehab Potential Good outlook, very active prior to surgery, though significant atrophy currently noted.    PT Frequency 2x / week   PT Duration 12 weeks   PT Treatment/Interventions Aquatic Therapy;Electrical Stimulation;Biofeedback;Cryotherapy;Balance training;Therapeutic exercise;Therapeutic activities;Manual techniques;Taping;Dry needling;Scar mobilization;Neuromuscular re-education;Gait training;Stair training;DME Instruction   PT  Next Visit Plan E-stim/russian stim for quad atrophy, quad sets, sidelying hip  abductions, gait training, progress with protocol when received. Flexion ROM progressions.    PT Home Exercise Plan Hip extensions, abductions with brace locked in extension, heel slides.    Consulted and Agree with Plan of Care Patient      Patient will benefit from skilled therapeutic intervention in order to improve the following deficits and impairments:  Abnormal gait, Decreased balance, Decreased activity tolerance, Decreased knowledge of precautions, Decreased knowledge of use of DME, Pain, Decreased strength, Decreased mobility, Difficulty walking, Decreased range of motion, Decreased endurance  Visit Diagnosis: Pain in left knee  Difficulty in walking, not elsewhere classified     Problem List There are no active problems to display for this patient.   Joneen Boers PT, DPT   03/23/2016, 12:34 PM  [place] [facility] [address] [place], [address] Phone: [phone]   Fax:  [phone]  Name: WOODRUFF DEGOLIER MRN: [id number] Date of Birth: [birth year]

## 2016-03-23 NOTE — Patient Instructions (Signed)
Hip Flexion / Knee Extension: Straight-Leg Raise (Eccentric)    With your knee in your brace, locked in extension:  Lie on back. Lift leg with knee straight. Use a strap to help your raise your leg up, keeping your knee straight.  Pain free range. ___10 reps per set, __3_ sets per day. Perform every other day.

## 2016-03-24 ENCOUNTER — Ambulatory Visit: Payer: Worker's Compensation | Attending: Orthopedic Surgery | Admitting: Physical Therapy

## 2016-03-24 DIAGNOSIS — M25562 Pain in left knee: Secondary | ICD-10-CM

## 2016-03-24 DIAGNOSIS — R262 Difficulty in walking, not elsewhere classified: Secondary | ICD-10-CM | POA: Insufficient documentation

## 2016-03-24 NOTE — Therapy (Signed)
[place] [facility] [address], [place], [address] Phone: [phone]   Fax:  [phone]  Physical Therapy Treatment  Patient Details  Name: Mark Rubio MRN: [id number] Date of Birth: Aug 02, 1966 No Data Recorded  Encounter Date: 03/24/2016      PT End of Session - 03/24/16 1001    Visit Number 9   Number of Visits 25   Date for PT Re-Evaluation 05/31/16   PT Start Time 0902   PT Stop Time 0942   PT Time Calculation (min) 40 min   Activity Tolerance Patient tolerated treatment well   Behavior During Therapy [facility] for tasks assessed/performed      Past Medical History  Diagnosis Date  . Medical history non-contributory     Past Surgical History  Procedure Laterality Date  . No past surgeries    . Patellar tendon repair Left 01/01/2016    Procedure: PATELLA TENDON REPAIR;  Surgeon: Dereck Leep, MD;  Location: [facility] ORS;  Service: Orthopedics;  Laterality: Left;    There were no vitals filed for this visit.      Subjective Assessment - 03/24/16 0904    Subjective Patient reports he continues to have swelling in his ankle, no pain in the knee. He has been diligent with his HEP. He feels he is progressing well with his ROM.    Pertinent History Per physician order can increase brace gradually by 10 degrees per week. Denies numbness/tingling in LLE.    Limitations Sitting;Lifting;Standing;Walking   Diagnostic tests MRI confirming patellar tendon tear.    Patient Stated Goals To return to ADLs and household/work related    Currently in Pain? Yes   Pain Score --  Swelling/pain in his L ankle, which has been present off and on since his surgery.   Pain Location Ankle   Pain Orientation Left   Pain Descriptors / Indicators Aching;Tightness   Pain Type Surgical pain   Pain Onset More than a month ago   Pain Frequency Intermittent   Aggravating Factors  Walking       Seated A-P mobilizations at end  range (grade III) 6 bouts x 15-40" to increase knee flexion ROM.  Soft tissue mobilization to distal quadricep -- firm spot which was initially tender noted, relieved after several minutes of STM  Gait assessment- normal oscillations of shoulders with minimal trunk compensations noted with SPC.   Seated knee flexion to 89 degrees  Rocking back to activate quadriceps (LLE behind RLE) x 15 for 2 sets   Squats in closed chain with HHA (to 40 degrees knee flexion) 3 sets x 10 repetitions  -- Weight shifting to his R side, manual cuing provided to avoid.                            PT Education - 03/24/16 1000    Education provided Yes   Education Details Progression with knee flexion ROM, progression with ther-ex over the next week to two weeks.    Person(s) Educated Patient   Methods Explanation;Verbal cues   Comprehension Verbalized understanding             PT Long Term Goals - 02/23/16 1256    PT LONG TERM GOAL #1   Title Patient will demonstrate full knee extension and at least 110 degrees of knee flexion to demonstrate improved ability to perform community mobility.    Time 12  Period Weeks   Status New   PT LONG TERM GOAL #2   Title Patient will ambulate at least 1000' with LRAD to complete 6MW test to demonstrate community mobility.    Time 12   Period Weeks   Status New   PT LONG TERM GOAL #3   Title Patient will demonstrate at least 4/5 L knee extensor strength to demonstrate improved safety with gait.    Baseline 1/5    Time 12   Period Weeks   Status New   PT LONG TERM GOAL #4   Title Patient will perform 5x sit to stand in < 13 seconds without use of UEs to demonstrate reduced falls risk with LRAD.    Time 12   Period Weeks   Status New   PT LONG TERM GOAL #5   Title Patient will report worst pain of no more than 1/10 to demonstrate improved tolerance for ADLs.    Baseline 2/10   Time 12   Period Weeks   Status New   Additional  Long Term Goals   Additional Long Term Goals Yes   PT LONG TERM GOAL #6   Title Patient will complete parallel squat to demonstrate ability to complete lifting mechanics for ADLs.    Time 12   Period Weeks   Status New               Plan - 03/24/16 1001    Clinical Impression Statement Patient progressing with knee flexion ROM ot 89 degrees with firm end feel, indicating additional ROM to be gained. He increases his brace to 90 degrees tomorrow. He does laterally weight shift to his R during mini-squats however he is able to complete with no pain in his knee. He continues to present with significant atrophy of his quadriceps (especially VMO/VL). Per [facility] protocol he can begin light open chain quadriceps strengthening focus.    Rehab Potential Good   Clinical Impairments Affecting Rehab Potential Good outlook, very active prior to surgery, though significant atrophy currently noted.    PT Frequency 2x / week   PT Duration 12 weeks   PT Treatment/Interventions Aquatic Therapy;Electrical Stimulation;Biofeedback;Cryotherapy;Balance training;Therapeutic exercise;Therapeutic activities;Manual techniques;Taping;Dry needling;Scar mobilization;Neuromuscular re-education;Gait training;Stair training;DME Instruction   PT Next Visit Plan E-stim/russian stim for quad atrophy, quad sets, sidelying hip  abductions, gait training, progress with protocol when received. Flexion ROM progressions.    PT Home Exercise Plan Hip extensions, abductions with brace locked in extension, heel slides.    Consulted and Agree with Plan of Care Patient      Patient will benefit from skilled therapeutic intervention in order to improve the following deficits and impairments:  Abnormal gait, Decreased balance, Decreased activity tolerance, Decreased knowledge of precautions, Decreased knowledge of use of DME, Pain, Decreased strength, Decreased mobility, Difficulty walking, Decreased range of motion, Decreased  endurance  Visit Diagnosis: Pain in left knee  Difficulty in walking, not elsewhere classified     Problem List There are no active problems to display for this patient.  Kerman Passey, PT, DPT    03/24/2016, 11:02 AM  [place] [facility] [address] [place], [address] Phone: [phone]   Fax:  [phone]  Name: Mark Rubio MRN: [id number] Date of Birth: 08-Jul-1966

## 2016-03-28 ENCOUNTER — Telehealth: Payer: Self-pay | Admitting: Physical Therapy

## 2016-03-28 ENCOUNTER — Ambulatory Visit: Payer: Worker's Compensation

## 2016-03-28 NOTE — Telephone Encounter (Signed)
This Chief Strategy Officer received a call from [place] regarding Mr. Truelock. She inquired about what the difficulty with his rehab has been. PT explained that patient did not have an eval for 8 weeks after therapy. He presented without surgical protocol and on order the notes indicated 2-3 treatments per week and to increase brace by 10 degrees per week. PT made several attempts to contact surgical team to discuss protocol and was unable to have a conversation with surgical team. Initially patient was scheduled for 1-2x per week for first 1-2 weeks of treatment. Case Manager Beau Fanny contacted PT roughly 2 weeks ago regarding his latest office visit with surgical regarding concern about decreased ROM around his knee. She informed PT, that surgical team was concerned about lack of flexion ROM, wanted patient to increase his frequency with PT or go to a different location. PT discussed with co-workers to make appropriate accommodations so that patient could have 3 sessions per week with 2 different therapists. Patient has been completing this for roughly 1-2 weeks now with improvements in ROM. This Pryor Curia also provided hand written note for surgical team prior to patient making the above mentioned visit, no response was provided regarding progression to active quadriceps activity, weightbearing status, weaning patient off of cane, etc. The phone call concluded with [facility] informing PT that patient will be going to another clinic. This therapist worked off of [employer] for Pharmacist, hospital, which is published online and informed RN of such.

## 2016-03-29 ENCOUNTER — Ambulatory Visit: Payer: Worker's Compensation | Admitting: Physical Therapy

## 2016-03-31 ENCOUNTER — Encounter: Payer: Worker's Compensation | Admitting: Physical Therapy

## 2016-04-05 ENCOUNTER — Encounter: Payer: Worker's Compensation | Admitting: Physical Therapy

## 2016-04-07 ENCOUNTER — Encounter: Payer: Worker's Compensation | Admitting: Physical Therapy

## 2016-04-12 ENCOUNTER — Encounter: Payer: Worker's Compensation | Admitting: Physical Therapy

## 2016-04-14 ENCOUNTER — Encounter: Payer: Worker's Compensation | Admitting: Physical Therapy

## 2016-04-19 ENCOUNTER — Encounter: Payer: Worker's Compensation | Admitting: Physical Therapy

## 2016-04-21 ENCOUNTER — Encounter: Payer: Worker's Compensation | Admitting: Physical Therapy

## 2017-05-21 IMAGING — CR DG KNEE 1-2V*L*
1 series · 1 of 1 positions shown · non-contrast
Comparison: Left knee MRI 12/31/2015

CLINICAL DATA: Patellar tendon repair.

EXAM:
LEFT KNEE - 1-2 VIEW; DG C-ARM 61-120 MIN

[1]
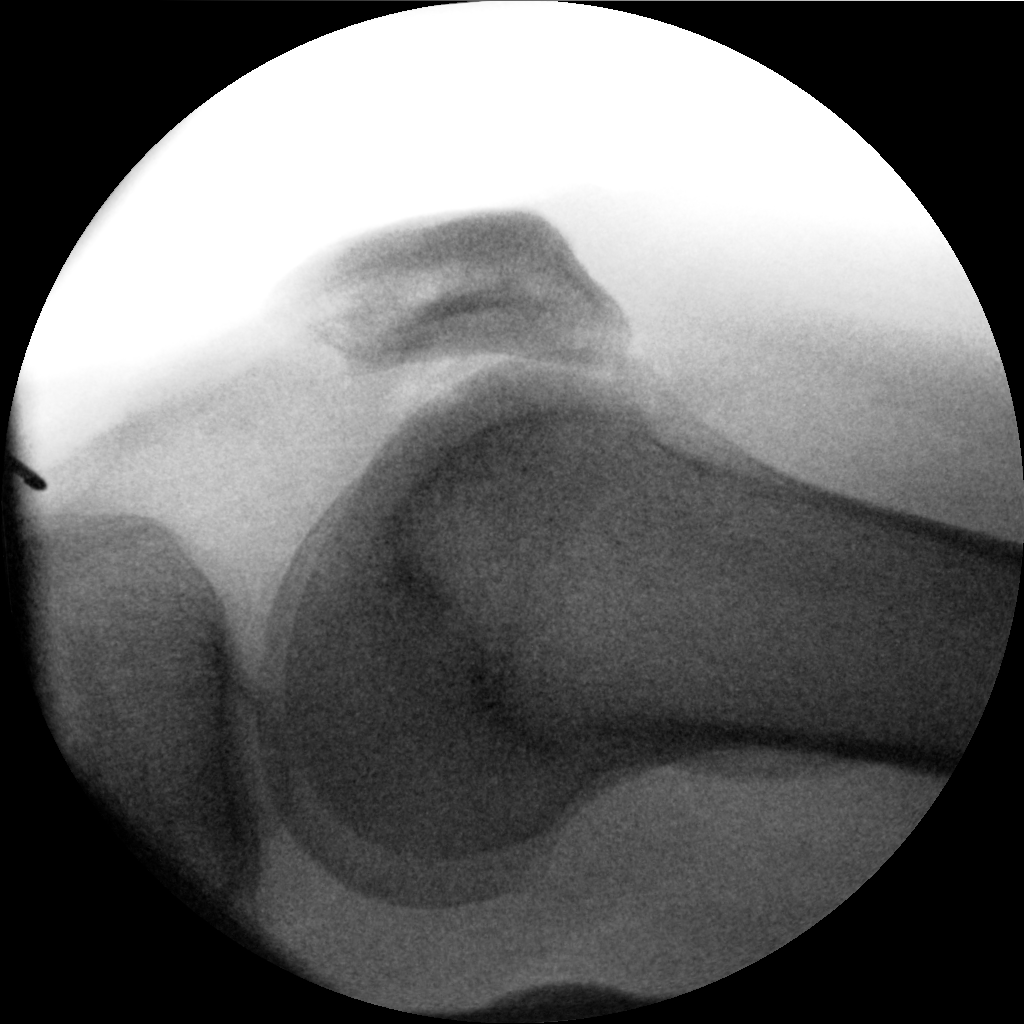

[1 of 1 positions shown; findings below may reference images not displayed]

FINDINGS: Single lateral intraoperative fluoroscopic image of the knee was
presented. No definite acute osseous abnormality.
IMPRESSION: Single intraoperative fluoroscopic image of the left knee.

## 2019-05-21 ENCOUNTER — Other Ambulatory Visit: Payer: Self-pay

## 2019-05-21 ENCOUNTER — Encounter: Payer: Self-pay | Admitting: Internal Medicine

## 2019-05-21 ENCOUNTER — Ambulatory Visit: Payer: 59 | Admitting: Internal Medicine

## 2019-05-21 VITALS — BP 159/88 | HR 75 | Temp 98.1°F | Resp 16 | Ht 77.0 in | Wt 268.0 lb

## 2019-05-21 DIAGNOSIS — Z789 Other specified health status: Secondary | ICD-10-CM

## 2019-05-21 DIAGNOSIS — E6609 Other obesity due to excess calories: Secondary | ICD-10-CM

## 2019-05-21 DIAGNOSIS — M545 Low back pain, unspecified: Secondary | ICD-10-CM | POA: Insufficient documentation

## 2019-05-21 DIAGNOSIS — K625 Hemorrhage of anus and rectum: Secondary | ICD-10-CM

## 2019-05-21 DIAGNOSIS — E7849 Other hyperlipidemia: Secondary | ICD-10-CM | POA: Insufficient documentation

## 2019-05-21 DIAGNOSIS — R7301 Impaired fasting glucose: Secondary | ICD-10-CM | POA: Insufficient documentation

## 2019-05-21 DIAGNOSIS — Z6831 Body mass index (BMI) 31.0-31.9, adult: Secondary | ICD-10-CM

## 2019-05-21 DIAGNOSIS — R030 Elevated blood-pressure reading, without diagnosis of hypertension: Secondary | ICD-10-CM | POA: Insufficient documentation

## 2019-05-21 NOTE — Patient Instructions (Signed)
Please call to schedule the colonoscopy.  Stay well hydrated.

## 2019-05-21 NOTE — Progress Notes (Signed)
Mark Rubio - Patient presents with BRBPR that started over the weekend. No pain with BM'Mark Rubio and no abdominal pains, not strain with BM'Mark Rubio, denied very hard stools and did note occas constipated with this warmer weather and possible less well hydrated. Has had BRBPR in his past, last episode months ago noted. Denies any hemorrhoid history. Not have any bleeding outside off BM'Mark Rubio and not have a BM yet today.   Noted his back has been bothering him for past 1-2 months, thinks he pulled a muscle at work as source, no one time incident could recall, and has been dealing with it and feels ok today. Notes sometimes when he bends over is problematic, and sometimes when sleeping and moves can be uncomfortable. Noted was often felt in LB muscles on left, and then at times noted was more on right recently. No pains down the legs, no LE weakness, no bowel or bladder incont, and has been applying heating pad intermittently to help.   Was last seen in Feb and rec'ed a f/u in 3 months for BP check as higher on that visit (140/90 and 150/90). He also had prediabetes concern and wanted to f/u. He noted no h/o higher BP'Mark Rubio and never on meds for BP in past. Has a + FH for HTN (Mom).  Denies any HA'Mark Rubio, vision changes, CP, palp'Mark Rubio, LE swelling.  Also on crestor for high lipids.  Has been feeling well with no complaints No CP, SOB, marked fatigue, HA, vision changes.    Drinks beer daily (1-2) and heavier alcohol use noted about every other weekend often.  No tob use hx  No reg exercise - through work active  Rec'ed a screening colonoscopy after his visit in Feb, and he has not followed through with this (he noted he would schedule then).  No Known Allergies    Current Outpatient Medications on File Prior to Visit  Medication Sig Dispense Refill  . rosuvastatin (CRESTOR) 20 MG tablet Take 20 mg by mouth daily.    . sildenafil (VIAGRA) 100 MG tablet TAKE 1 TABLET BY MOUTH 30 TO 60 MINUTES PRIOR TO SEXUAL ACTIVITIES     No  current facility-administered medications on file prior to visit.    FH - + HTN, No CA   O - NAD, masked   BP (!) 159/88 (BP Location: Left Arm, Patient Position: Sitting, Cuff Size: Large)   Pulse 75   Temp 98.1 F (36.7 C) (Oral)   Resp 16   Ht 6\' 5"  (1.956 m)   Wt 268 lb (121.6 kg)   SpO2 98%   BMI 31.78 kg/m    recheck BP - 167/96 by machine by me in office   HEENT - sclera anicteric Car - RRR  Abd - mildly obese, soft, NT, ND, BS +, no masses, no obvious HSM,  Back - no CVA tenderness, NT to palpation over spine nor over lumbar muscles on right or left where he notes he occas feels the pain, could bend to touch his toes in the office without limitation, SLR neg,  Ext - no LE edema, good strength in LE'Mark Rubio including with dorsi and plantar flexion of feet bilat Neuro - Affect not flat, approp with conversation, DTR'Mark Rubio 2+ and = in LE'Mark Rubio,    Last labs reviewed from Feb - glucose 114, LDL - 111 (as high as 162 prior)   A/P 1. BRBPR  Educated, prob very distal source with BRB noted No obvious hemorrhoid on exam Has not followed through with colonoscopy  rec;ed for screening purposes in the recent past and he noted he would when discussed can help make sure no other more concerning sources to the BRBPR. He noted he has the # to call to get set up and rec'ed he do so. F/u if not resolving, more sx'Mark Rubio arise in association  2. Increased BP - concern not as well controlled as needs to be noted, no h/o HTN nor meds to treat in his past  The concern and need for BP medications discussed Discussed goals for good control of BP  Importance of healthy diet and regular aerobic exercise and weight control noted Will f/u in approx 4 weeks after labs obtained and noted if BP remaining higher, will rec starting a medication to help  3. LBP - intermittent and he thinks from a muscle pull 1-2 months ago. Not able to define a one time event when it happened, no radicular sx'Mark Rubio or signs of  concern  Cont symptomatic measures, heat seems to help some and can cont Staying active important and he noted he has been working and more careful and not limited to date and will continue F/u if worsening  4. IFG - glucose higher on last check noted  Discussed diet modifications and lessening alcohol use to help Last visit also discussed and educated on this (prediabetes) Will recheck a glucose and A1C in 3-4 weeks prior to f/u visit scheduled  5.  Hyperlipidemia   statin to continue and diet/exercsie important as well noted  6. Obesity   7. Alcohol use - noted in moderation important and rec'ed decreasing some would be beneficial  F/u in 3-4 weeks after BMP and A1C obtained

## 2019-06-04 ENCOUNTER — Other Ambulatory Visit: Payer: Self-pay

## 2019-06-04 MED ORDER — ROSUVASTATIN CALCIUM 20 MG PO TABS: 20.0000 mg | ORAL_TABLET | Freq: Every day | ORAL | 3 refills | Status: DC

## 2019-06-11 ENCOUNTER — Other Ambulatory Visit: Payer: 59

## 2019-06-11 ENCOUNTER — Other Ambulatory Visit: Payer: Self-pay

## 2019-06-11 NOTE — Addendum Note (Signed)
Addended by: Virgilio Frees on: 06/11/2019 08:46 AM   Modules accepted: Orders

## 2019-06-12 LAB — BASIC METABOLIC PANEL
BUN/Creatinine Ratio: 9 (ref 9–20)
BUN: 11 mg/dL (ref 6–24)
CO2: 21 mmol/L (ref 20–29)
Calcium: 9.3 mg/dL (ref 8.7–10.2)
Chloride: 106 mmol/L (ref 96–106)
Creatinine, Ser: 1.22 mg/dL (ref 0.76–1.27)
GFR calc Af Amer: 77 mL/min/{1.73_m2} (ref >59)
GFR calc non Af Amer: 67 mL/min/{1.73_m2} (ref >59)
Glucose: 103 mg/dL — ABNORMAL HIGH (ref 65–99)
Potassium: 4.5 mmol/L (ref 3.5–5.2)
Sodium: 141 mmol/L (ref 134–144)

## 2019-06-12 LAB — HGB A1C W/O EAG: Hgb A1c MFr Bld: 5.4 % (ref 4.8–5.6)

## 2019-06-18 ENCOUNTER — Ambulatory Visit: Payer: 59 | Admitting: Internal Medicine

## 2019-06-26 ENCOUNTER — Ambulatory Visit: Payer: 59 | Admitting: Internal Medicine

## 2019-07-02 ENCOUNTER — Encounter: Payer: Self-pay | Admitting: Internal Medicine

## 2019-07-02 ENCOUNTER — Other Ambulatory Visit: Payer: Self-pay

## 2019-07-02 ENCOUNTER — Ambulatory Visit: Payer: 59 | Admitting: Internal Medicine

## 2019-07-02 VITALS — BP 143/90 | HR 85 | Temp 97.9°F | Resp 14 | Ht 77.0 in | Wt 268.0 lb

## 2019-07-02 DIAGNOSIS — D369 Benign neoplasm, unspecified site: Secondary | ICD-10-CM

## 2019-07-02 DIAGNOSIS — M545 Low back pain, unspecified: Secondary | ICD-10-CM

## 2019-07-02 DIAGNOSIS — Z6831 Body mass index (BMI) 31.0-31.9, adult: Secondary | ICD-10-CM

## 2019-07-02 DIAGNOSIS — R030 Elevated blood-pressure reading, without diagnosis of hypertension: Secondary | ICD-10-CM

## 2019-07-02 DIAGNOSIS — E6609 Other obesity due to excess calories: Secondary | ICD-10-CM

## 2019-07-02 DIAGNOSIS — E7849 Other hyperlipidemia: Secondary | ICD-10-CM

## 2019-07-02 DIAGNOSIS — R7301 Impaired fasting glucose: Secondary | ICD-10-CM

## 2019-07-02 NOTE — Progress Notes (Signed)
S - Patient presents for f/u of his BP and after labs with concerns for IFG last visit as well in late July.  He had BRBPR noted then and did have a f/u colonoscopy and had tubular adenoma diagnosed and f/u rec'ed again in 5 years   Has  Had some borderline higher BP readings in recent past and last visit was 150's/88. He also had prediabetes concern and wanted to f/u after labs obtained. He noted never on meds for BP in past.  Has a + FH for HTN (Mom).  Denies any recent HA's, vision changes, CP, palp's, LE swelling, still some intermittent LB discomfort, notes gets quite stiff at times,  no pains down legs  Also on crestor for high lipids.    No tob use hx  No reg exercise - through work active  No Known Allergies  Current Outpatient Medications on File Prior to Visit  Medication Sig Dispense Refill  . rosuvastatin (CRESTOR) 20 MG tablet Take 1 tablet (20 mg total) by mouth daily. 90 tablet 3  . sildenafil (VIAGRA) 100 MG tablet TAKE 1 TABLET BY MOUTH 30 TO 60 MINUTES PRIOR TO SEXUAL ACTIVITIES     No current facility-administered medications on file prior to visit.     FH - + HTN, No CA  O - NAD, masked  BP (!) 143/90 (BP Location: Right Arm, Patient Position: Sitting, Cuff Size: Large)   Pulse 85   Temp 97.9 F (36.6 C) (Oral)   Resp 14   Ht 6\' 5"  (1.956 m)   Wt 268 lb (121.6 kg)   SpO2 98%   BMI 31.78 kg/m    Recheck by me 137/86 with machine left Weight last visit 268  HEENT - sclera anicteric Car - RRR  Pulm - CTA Ext - no increased LE edema, left still slightly more swollen than right after had prior trauma and not new  Labs reviewed - glucose - 103, BMP normal, A1C - 5.4  A/P 1. BRBPR hx - had colonoscopy since last visit, tubular adenoma dx'ed and patient had f/u to review these results noted in Epic  F/u in 5 years rec'ed   2. Increased BP in recent past - BP today on recheck ok,   The concern and need for BP medications discussed, agreed  to wait and not add a medicine today, as borderline readings noted,  Discussed goals for good control of BP  Continue to monitor, he does not have a way to check on outside presently, and usually has f/u annuals in October and will check status then at latest (anticpate may need to add BP med in near future)  3. LBP - intermittent, no radicular sx's or signs of concern  Cont symptomatic measures, heat seems to help some and can continue with ROM exercises after warm recommended. Staying active important and he noted he has been working and more careful and not limited to date and will continue F/u if worsening as discussed  4. IFG - A1C ok on recent check  Discussed diet modifications and lessening alcohol use to help last visit and continue Educated on this and prediabetes  Monitor presently  5.  Hyperlipidemia   statin to continue and diet/exercsie important as well noted  6. Obesity

## 2019-07-26 ENCOUNTER — Ambulatory Visit: Payer: Self-pay

## 2019-07-26 DIAGNOSIS — Z23 Encounter for immunization: Secondary | ICD-10-CM

## 2019-11-07 ENCOUNTER — Ambulatory Visit: Payer: Self-pay

## 2019-11-12 ENCOUNTER — Ambulatory Visit: Payer: Self-pay | Admitting: Occupational Medicine

## 2019-11-12 ENCOUNTER — Other Ambulatory Visit: Payer: Self-pay

## 2019-11-12 VITALS — BP 152/92 | HR 82 | Temp 98.2°F | Ht 77.0 in | Wt 267.8 lb

## 2019-11-12 DIAGNOSIS — M545 Low back pain, unspecified: Secondary | ICD-10-CM

## 2019-11-12 MED ORDER — CYCLOBENZAPRINE HCL 10 MG PO TABS: 10.0000 mg | ORAL_TABLET | Freq: Every day | ORAL | 0 refills | Status: DC

## 2019-11-12 NOTE — Progress Notes (Signed)
   Subjective:    Patient ID: DUSHAUN NISBET, male    DOB: June 28, 1966, 54 y.o.   MRN: [id number]  HPI Mr. Zamora presents with history of low back pain for about a week.  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  Since then he has been having pain in his low central back.  No reports of leg numbness weakness or pain.  No history of serious back injury or lumbar procedures in the past.  He has been having trouble sleeping due to low back pain.  He has been working light duty as well.    Review of Systems     Objective:   Physical Exam  Appears in minor distress.  Lumbar range of motion is full but slow.  Tenderness to palpation is noted in the lumbar paraspinal muscles.  Normal strength throughout flexes and sensory in the lower extremities.      Assessment & Plan:  Low back pain-recommend conservative management with over-the-counter ibuprofen 2 tablets 3 times a day and I will E prescribe Flexeril 10 mg at bedtime only to the [facility].  Qualified for alternate duty with no lifting more than 15 pounds limit frequent bending.  Follow-up in 1 week.

## 2019-11-13 ENCOUNTER — Ambulatory Visit: Payer: Self-pay

## 2019-11-19 ENCOUNTER — Other Ambulatory Visit: Payer: Self-pay

## 2019-11-19 ENCOUNTER — Ambulatory Visit: Payer: Self-pay | Admitting: Occupational Medicine

## 2019-11-19 VITALS — BP 140/96 | HR 79 | Temp 99.2°F | Ht 77.0 in | Wt 276.8 lb

## 2019-11-19 DIAGNOSIS — M545 Low back pain, unspecified: Secondary | ICD-10-CM

## 2019-11-19 NOTE — Progress Notes (Signed)
   Subjective:    Patient ID: Mark Rubio, male    DOB: 1966-07-01, 54 y.o.   MRN: [id number]  HPI presents in follow-up for work-related low back injury from a couple weeks ago.  I saw him a week ago for initial consultation.  At that time I recommended conservative management with Flexeril at bedtime, alternate duty, and ibuprofen during the day.  He is doing well with this and says his pain has improved quite a bit.  He says he is pain-free about 50% of the time.  Still complains of some discomfort with activity in his low central back.  No leg numbness weakness or pain.  Sleeping well at night with Flexeril.    Review of Systems     Objective:   Physical Exam   Healthy appearing adult male in no distress.  Transitions are smooth and effortless.  Walks with a normal gait.  Full range of motion to lumbar spine.  No tenderness to palpation in the lumbar paraspinal muscles.  Normal strength sensory and reflexes in lower extremities.  Today's Vitals   11/19/19 1517  BP: (!) 140/96  Pulse: 79  Temp: 99.2 F (37.3 C)  TempSrc: Oral  SpO2: 98%  Weight: 276 lb 12.8 oz (125.6 kg)  Height: 6\' 5"  (1.956 m)  PainSc: 5   PainLoc: Back   Body mass index is 32.82 kg/m.      Assessment & Plan:  Low back pain-improving with conservative management.  We will continue light duty through the rest of this week and may return to work full duty starting Monday, February 1.  Work note was given.  Follow-up for this occupational health injury as needed.

## 2019-12-25 ENCOUNTER — Other Ambulatory Visit: Payer: Self-pay

## 2019-12-25 DIAGNOSIS — N529 Male erectile dysfunction, unspecified: Secondary | ICD-10-CM

## 2019-12-25 MED ORDER — SILDENAFIL CITRATE 100 MG PO TABS
ORAL_TABLET | ORAL | 0 refills | Status: DC
Start: 2019-12-25 — End: 2020-12-29

## 2019-12-25 NOTE — Telephone Encounter (Signed)
Cr 1.22 05/2019.  BP 140/96 office visit 11/19/2019 with Dr Geoffry Paradise for low back pain  Annual physical 12/13/2018 with Dr Roxan Hockey sildenafil 100mg  prn po working well.  Bridge refill until physical scheduled as due.  Electronic Rx sildenafil 100mg  po prn  30 to 60 minutes prior to sexual activity #30 RF0  Max 1 tab/100mg  per 24 hours

## 2020-01-10 DIAGNOSIS — R29898 Other symptoms and signs involving the musculoskeletal system: Secondary | ICD-10-CM | POA: Insufficient documentation

## 2020-01-10 DIAGNOSIS — N529 Male erectile dysfunction, unspecified: Secondary | ICD-10-CM | POA: Insufficient documentation

## 2020-01-10 DIAGNOSIS — R7303 Prediabetes: Secondary | ICD-10-CM | POA: Insufficient documentation

## 2020-01-10 NOTE — Patient Instructions (Signed)
Scheduled to complete physical 01/20/20 with Letitia Neri, PA-C  AMD

## 2020-01-13 ENCOUNTER — Ambulatory Visit: Payer: Self-pay

## 2020-01-13 ENCOUNTER — Other Ambulatory Visit: Payer: Self-pay

## 2020-01-13 DIAGNOSIS — N529 Male erectile dysfunction, unspecified: Secondary | ICD-10-CM

## 2020-01-13 DIAGNOSIS — R29898 Other symptoms and signs involving the musculoskeletal system: Secondary | ICD-10-CM

## 2020-01-13 DIAGNOSIS — Z0000 Encounter for general adult medical examination without abnormal findings: Secondary | ICD-10-CM

## 2020-01-13 DIAGNOSIS — R7303 Prediabetes: Secondary | ICD-10-CM

## 2020-01-13 LAB — POCT URINALYSIS DIPSTICK
Bilirubin, UA: NEGATIVE
Blood, UA: NEGATIVE
Glucose, UA: NEGATIVE
Ketones, UA: NEGATIVE
Leukocytes, UA: NEGATIVE
Nitrite, UA: NEGATIVE
Protein, UA: NEGATIVE
Spec Grav, UA: >=1.03 — AB (ref 1.010–1.025)
Urobilinogen, UA: 0.2 E.U./dL
pH, UA: 5.5 (ref 5.0–8.0)

## 2020-01-14 LAB — CMP12+LP+TP+TSH+6AC+PSA+CBC?
Albumin/Globulin Ratio: 1.9 (ref 1.2–2.2)
Alkaline Phosphatase: 81 IU/L (ref 39–117)
BUN/Creatinine Ratio: 11 (ref 9–20)
Calcium: 9.4 mg/dL (ref 8.7–10.2)
Cholesterol, Total: 170 mg/dL (ref 100–199)
Creatinine, Ser: 1.31 mg/dL — ABNORMAL HIGH (ref 0.76–1.27)
EOS (ABSOLUTE): 0 10*3/uL (ref 0.0–0.4)
Eos: 1 %
Estimated CHD Risk: 0.6 times avg. (ref 0.0–1.0)
GFR calc Af Amer: 71 mL/min/{1.73_m2} (ref >59)
GGT: 33 IU/L (ref 0–65)
Globulin, Total: 2.5 g/dL (ref 1.5–4.5)
Glucose: 117 mg/dL — ABNORMAL HIGH (ref 65–99)
HDL: 46 mg/dL (ref >39)
Immature Grans (Abs): 0 10*3/uL (ref 0.0–0.1)
Iron: 126 ug/dL (ref 38–169)
LDH: 149 IU/L (ref 121–224)
LDL Chol Calc (NIH): 91 mg/dL (ref 0–99)
MCH: 28.7 pg (ref 26.6–33.0)
MCHC: 33.5 g/dL (ref 31.5–35.7)
Phosphorus: 3.2 mg/dL (ref 2.8–4.1)
Potassium: 4.2 mmol/L (ref 3.5–5.2)
Prostate Specific Ag, Serum: 0.6 ng/mL (ref 0.0–4.0)
T4, Total: 3.8 ug/dL — ABNORMAL LOW (ref 4.5–12.0)
TSH: 2.99 u[IU]/mL (ref 0.450–4.500)
Total Protein: 7.3 g/dL (ref 6.0–8.5)
Uric Acid: 6.8 mg/dL (ref 3.8–8.4)

## 2020-01-14 LAB — CMP12+LP+TP+TSH+6AC+PSA+CBC…
ALT: 19 IU/L (ref 0–44)
AST: 18 IU/L (ref 0–40)
Albumin: 4.8 g/dL (ref 3.8–4.9)
BUN: 14 mg/dL (ref 6–24)
Basophils Absolute: 0 10*3/uL (ref 0.0–0.2)
Basos: 1 %
Bilirubin Total: 1.3 mg/dL — ABNORMAL HIGH (ref 0.0–1.2)
Chloride: 105 mmol/L (ref 96–106)
Chol/HDL Ratio: 3.7 ratio (ref 0.0–5.0)
Free Thyroxine Index: 1.1 — ABNORMAL LOW (ref 1.2–4.9)
GFR calc non Af Amer: 61 mL/min/{1.73_m2} (ref >59)
Hematocrit: 40 % (ref 37.5–51.0)
Hemoglobin: 13.4 g/dL (ref 13.0–17.7)
Immature Granulocytes: 0 %
Lymphocytes Absolute: 1.2 10*3/uL (ref 0.7–3.1)
Lymphs: 33 %
MCV: 86 fL (ref 79–97)
Monocytes Absolute: 0.5 10*3/uL (ref 0.1–0.9)
Monocytes: 12 %
Neutrophils Absolute: 1.9 10*3/uL (ref 1.4–7.0)
Neutrophils: 53 %
Platelets: 146 10*3/uL — ABNORMAL LOW (ref 150–450)
RBC: 4.67 x10E6/uL (ref 4.14–5.80)
RDW: 12.2 % (ref 11.6–15.4)
Sodium: 142 mmol/L (ref 134–144)
T3 Uptake Ratio: 29 % (ref 24–39)
Triglycerides: 194 mg/dL — ABNORMAL HIGH (ref 0–149)
VLDL Cholesterol Cal: 33 mg/dL (ref 5–40)
WBC: 3.7 10*3/uL (ref 3.4–10.8)

## 2020-01-14 LAB — HGB A1C W/O EAG: Hgb A1c MFr Bld: 5.5 % (ref 4.8–5.6)

## 2020-01-20 ENCOUNTER — Encounter: Payer: Self-pay | Admitting: Emergency Medicine

## 2020-01-20 ENCOUNTER — Other Ambulatory Visit: Payer: Self-pay

## 2020-01-20 ENCOUNTER — Ambulatory Visit: Payer: Self-pay | Admitting: Emergency Medicine

## 2020-01-20 VITALS — BP 130/80 | HR 98 | Temp 98.7°F | Resp 16 | Ht 77.0 in | Wt 275.0 lb

## 2020-01-20 DIAGNOSIS — N529 Male erectile dysfunction, unspecified: Secondary | ICD-10-CM

## 2020-01-20 DIAGNOSIS — Z0000 Encounter for general adult medical examination without abnormal findings: Secondary | ICD-10-CM

## 2020-01-20 DIAGNOSIS — R7303 Prediabetes: Secondary | ICD-10-CM

## 2020-01-20 NOTE — Progress Notes (Signed)
Labs & EKG completed 01/13/2020.  AMD

## 2020-01-20 NOTE — Progress Notes (Signed)
____________________________________________   None    (approximate)  I have reviewed the  vital signs and the nursing notes.   HISTORY   Chief Complaint Annual Exam  HPI Mark Rubio is a 54 y.o. male presents to the clinic for his annual physical.  Patient does not have any complaints at this time and also states that he does not need any medications refilled.  He has gotten his Covid vaccine and did not have any difficulties with it.  Past Medical History:  Diagnosis Date  . Hyperlipidemia     Patient Active Problem List   Diagnosis Date Noted  . Pre-diabetes 01/10/2020  . Ankle weakness 01/10/2020  . ED (erectile dysfunction) 01/10/2020  . Tubular adenoma 07/02/2019  . IFG (impaired fasting glucose) 05/21/2019  . Elevated BP without diagnosis of hypertension 05/21/2019  . Other hyperlipidemia 05/21/2019  . Bright red blood per rectum 05/21/2019  . Acute low back pain without sciatica 05/21/2019  . Class 1 obesity due to excess calories without serious comorbidity with body mass index (BMI) of 31.0 to 31.9 in adult 05/21/2019  . Uses alcohol moderately 05/21/2019  . Rupture of left patellar tendon 02/14/2016    Past Surgical History:  Procedure Laterality Date  . PATELLAR TENDON REPAIR Left 01/01/2016   Procedure: PATELLA TENDON REPAIR;  Surgeon: Dereck Leep, MD;  Location: [facility] ORS;  Service: Orthopedics;  Laterality: Left;    Prior to Admission medications   Medication Sig Start Date End Date Taking? Authorizing Provider  sildenafil (VIAGRA) 100 MG tablet Take 1 tablet by mouth 30 to 60 minutes prior to sexual activities; max 1 tab per 24 hours 12/25/19  Yes Betancourt, Aura Fey, NP  cyclobenzaprine (FLEXERIL) 10 MG tablet Take 1 tablet (10 mg total) by mouth at bedtime. Patient not taking: Reported on 01/20/2020 11/12/19   Lossie Faes, MD  rosuvastatin (CRESTOR) 20 MG tablet Take 1 tablet (20 mg total) by mouth daily. 06/04/19 11/19/19  Towanda Malkin,  MD    Allergies Patient has no known allergies.  Family History  Problem Relation Age of Onset  . Hypertension Mother   . Diabetes Mother   . Lung cancer Father     Social History Social History   Tobacco Use  . Smoking status: Never Smoker  . Smokeless tobacco: Never Used  . Tobacco comment: no passive smokers in home  Substance Use Topics  . Alcohol use: Yes    Comment: beer occasionally  . Drug use: Not on file    Review of Systems Constitutional: No fever/chills Eyes: No visual changes. ENT: No sore throat. Cardiovascular: Denies chest pain. Respiratory: Denies shortness of breath. Gastrointestinal: No abdominal pain.  Genitourinary: Negative for dysuria. Musculoskeletal: Occasional ankle pain, chronic. Skin: Negative for rash. Neurological: Negative for headaches, .  ____________________________________________   PHYSICAL EXAM:  VITAL SIGNS: As noted in nursing notes. Constitutional: Alert and oriented. Well appearing and in no acute distress. Eyes: Conjunctivae are normal. PERRL. EOMI. Head: Atraumatic. Nose: No congestion/rhinnorhea. Mouth/Throat: Mucous membranes are moist.  Oropharynx non-erythematous. Neck: No stridor.  Hematological/Lymphatic/Immunilogical: No cervical lymphadenopathy. Cardiovascular: Normal rate, regular rhythm. Grossly normal heart sounds.  Good peripheral circulation. Respiratory: Normal respiratory effort.  No retractions. Lungs CTAB. Gastrointestinal: Soft and nontender. No distention.  No CVA tenderness. Musculoskeletal: Moves upper and lower extremities without any difficulty normal gait was noted.  Patient also was able to get from a sitting position to standing without any assistance. Neurologic:  Normal speech and language.  No gross focal neurologic deficits are appreciated. No gait instability. Skin:  Skin is warm, dry and intact. No rash noted. Psychiatric: Mood and affect are normal. Speech and behavior are  normal.  ____________________________________________   LABS (all labs ordered are listed, but only abnormal results are displayed)  Elevated glucose at 117 with a normal A1c.  Triglycerides elevated at 194.  BUN is normal creatinine is 1.31 and will continue to monitor.    INITIAL IMPRESSION / ASSESSMENT AND PLAN / ED COURSE  54 year old male presents to the clinic for an annual physical.  Lab work was discussed with him and also patient has a family history of diabetes and we will continue to monitor his fasting glucose and hemoglobin A1c.  At this time he does not require any prescriptions to be filled.  ____________________________________________   FINAL CLINICAL IMPRESSION(S) / ED DIAGNOSES Yearly physical without changes.  Patient will continue to take his regular medication including Crestor 20 mg daily.  Return as needed.    ED Discharge Orders    None       Note:  This document was prepared using Dragon voice recognition software and may include unintentional dictation errors.

## 2020-03-11 ENCOUNTER — Encounter: Payer: Self-pay | Admitting: Physician Assistant

## 2020-03-11 ENCOUNTER — Other Ambulatory Visit: Payer: Self-pay

## 2020-03-11 ENCOUNTER — Ambulatory Visit
Admission: RE | Admit: 2020-03-11 | Discharge: 2020-03-11 | Disposition: A | Discharge status: Home / Self Care | Payer: 59 | Attending: Physician Assistant | Admitting: Physician Assistant

## 2020-03-11 ENCOUNTER — Ambulatory Visit
Admission: RE | Admit: 2020-03-11 | Discharge: 2020-03-11 | Disposition: A | Discharge status: Home / Self Care | Payer: 59 | Source: Ambulatory Visit | Attending: Physician Assistant | Admitting: Physician Assistant

## 2020-03-11 ENCOUNTER — Ambulatory Visit: Payer: Self-pay | Admitting: Physician Assistant

## 2020-03-11 VITALS — BP 163/83 | HR 59 | Temp 97.6°F | Resp 16 | Ht 77.0 in | Wt 263.0 lb

## 2020-03-11 DIAGNOSIS — G8929 Other chronic pain: Secondary | ICD-10-CM | POA: Diagnosis present

## 2020-03-11 DIAGNOSIS — M545 Low back pain, unspecified: Secondary | ICD-10-CM

## 2020-03-11 MED ORDER — NAPROXEN 500 MG PO TABS: 500.0000 mg | ORAL_TABLET | Freq: Two times a day (BID) | ORAL | Status: DC

## 2020-03-11 MED ORDER — ORPHENADRINE CITRATE ER 100 MG PO TB12: 100.0000 mg | ORAL_TABLET | Freq: Every evening | ORAL | 0 refills | Status: DC

## 2020-03-11 NOTE — Progress Notes (Signed)
   Subjective: Chronic back pain    Patient ID: Mark Rubio, male    DOB: [birth year], 54 y.o.   MRN: [id number]  HPI Patient complains of chronic right back pain greater than 6 months.  Patient states no specific incident but his job does require repetitive bending and lifting.  Patient denies radicular component to his back pain.  Patient denies bladder or bowel dysfunction.   Review of Systems Hyperlipidemia and hypertension.    Objective:   Physical Exam No acute distress.  No obvious lumbar deformity.  Patient is full and equal range of motion at this time.  No CVA guarding.  Patient has moderate right para spinal muscle spasm with left lateral movements.  Patient has negative straight leg test in supine position.       Assessment & Plan: Chronic back pain.  Review of patient's imaging revealed mild degenerative changes 6 years ago at L L5-S1.  We will get repeat imagings for comparative study.  Patient given a prescription for Norflex to take nightly and naproxen to take twice a day.  Patient will follow up in 5 days.

## 2020-03-11 NOTE — Progress Notes (Signed)
Needs Crestor refilled.  Chronic Back Pain - stiff for a period of time for over a year.  Radiates down left into left hip. Has some stretches that he was previously given & does them occasionally. Intermittent use of ice & gets some relief. Used tylenol & it helped for a day or so.   AMD

## 2020-03-16 ENCOUNTER — Other Ambulatory Visit: Payer: Self-pay

## 2020-03-16 ENCOUNTER — Ambulatory Visit: Payer: Self-pay

## 2020-03-16 VITALS — BP 158/89 | HR 77 | Temp 97.9°F | Resp 12 | Ht 77.0 in | Wt 275.0 lb

## 2020-03-16 DIAGNOSIS — M545 Low back pain, unspecified: Secondary | ICD-10-CM

## 2020-03-16 NOTE — Progress Notes (Signed)
Seen 03/12/2019, instructed by Randel Pigg to return on Monday 03/16/20 CL,RMA

## 2020-03-16 NOTE — Progress Notes (Signed)
   Subjective: Chronic back pain    Patient ID: Mark Rubio, male    DOB: 06-12-1966, 54 y.o.   MRN: [id number]  HPI Patient returns with valuation of chronic back pain.  Patient was seen last week and in interim had a lumbar spine x-ray which was unremarkable.  Patient stated her walk relief with taking muscle relaxer at night and taking anti-inflammatories noted today.   Review of Systems    Back pain, hyperlipidemia, and erectile dysfunction. Objective:   Physical Exam No acute distress.  Patient is 6 feet 5 inches there is no obvious spinal deformity.  Patient has moderate guarding palpation of the left paraspinal muscle area.  Patient has full neck range of motion is at this time.       Assessment & Plan:  Chronic mechanical low back pain.  Advised patient to consider purchase a lumbar support to use while working.  Advised to continue using anti-inflammatory medication for flareups.  Follow-up if complaint worsens.

## 2020-07-03 ENCOUNTER — Other Ambulatory Visit: Payer: Self-pay | Admitting: Internal Medicine

## 2020-07-03 DIAGNOSIS — E7849 Other hyperlipidemia: Secondary | ICD-10-CM

## 2020-07-23 DIAGNOSIS — E039 Hypothyroidism, unspecified: Secondary | ICD-10-CM

## 2020-07-23 HISTORY — DX: Hypothyroidism, unspecified: E03.9

## 2020-12-21 NOTE — Progress Notes (Signed)
Pt scheduled to complete physical with Mardee Postin, PA 12/29/20. CL,RMA

## 2020-12-22 ENCOUNTER — Ambulatory Visit: Payer: Self-pay

## 2020-12-22 ENCOUNTER — Other Ambulatory Visit: Payer: Self-pay

## 2020-12-22 DIAGNOSIS — Z01818 Encounter for other preprocedural examination: Secondary | ICD-10-CM

## 2020-12-22 LAB — POCT URINALYSIS DIPSTICK
Bilirubin, UA: NEGATIVE
Blood, UA: NEGATIVE
Glucose, UA: NEGATIVE
Ketones, UA: NEGATIVE
Leukocytes, UA: NEGATIVE
Nitrite, UA: NEGATIVE
Protein, UA: NEGATIVE
Spec Grav, UA: 1.025 (ref 1.010–1.025)
Urobilinogen, UA: 0.2 E.U./dL
pH, UA: 6 (ref 5.0–8.0)

## 2020-12-23 LAB — CMP12+LP+TP+TSH+6AC+PSA+CBC…
ALT: 19 IU/L (ref 0–44)
AST: 21 IU/L (ref 0–40)
Albumin/Globulin Ratio: 1.8 (ref 1.2–2.2)
Albumin: 4.6 g/dL (ref 3.8–4.9)
Alkaline Phosphatase: 78 IU/L (ref 44–121)
BUN/Creatinine Ratio: 11 (ref 9–20)
BUN: 12 mg/dL (ref 6–24)
Basophils Absolute: 0 10*3/uL (ref 0.0–0.2)
Basos: 0 %
Bilirubin Total: 1 mg/dL (ref 0.0–1.2)
Calcium: 9.2 mg/dL (ref 8.7–10.2)
Chloride: 105 mmol/L (ref 96–106)
Chol/HDL Ratio: 4.3 ratio (ref 0.0–5.0)
Cholesterol, Total: 187 mg/dL (ref 100–199)
Creatinine, Ser: 1.13 mg/dL (ref 0.76–1.27)
EOS (ABSOLUTE): 0 10*3/uL (ref 0.0–0.4)
Eos: 1 %
Estimated CHD Risk: 0.8 times avg. (ref 0.0–1.0)
Free Thyroxine Index: 1.1 — ABNORMAL LOW (ref 1.2–4.9)
GGT: 38 IU/L (ref 0–65)
Globulin, Total: 2.6 g/dL (ref 1.5–4.5)
Glucose: 121 mg/dL — ABNORMAL HIGH (ref 65–99)
HDL: 44 mg/dL (ref >39)
Hematocrit: 39.2 % (ref 37.5–51.0)
Hemoglobin: 13.3 g/dL (ref 13.0–17.7)
Immature Grans (Abs): 0 10*3/uL (ref 0.0–0.1)
Immature Granulocytes: 1 %
Iron: 88 ug/dL (ref 38–169)
LDH: 145 IU/L (ref 121–224)
LDL Chol Calc (NIH): 103 mg/dL — ABNORMAL HIGH (ref 0–99)
Lymphocytes Absolute: 1.1 10*3/uL (ref 0.7–3.1)
Lymphs: 29 %
MCH: 29.2 pg (ref 26.6–33.0)
MCHC: 33.9 g/dL (ref 31.5–35.7)
MCV: 86 fL (ref 79–97)
Monocytes Absolute: 0.6 10*3/uL (ref 0.1–0.9)
Monocytes: 15 %
Neutrophils Absolute: 2.1 10*3/uL (ref 1.4–7.0)
Neutrophils: 54 %
Phosphorus: 3.1 mg/dL (ref 2.8–4.1)
Platelets: 158 10*3/uL (ref 150–450)
Potassium: 4.6 mmol/L (ref 3.5–5.2)
Prostate Specific Ag, Serum: 0.3 ng/mL (ref 0.0–4.0)
RBC: 4.56 x10E6/uL (ref 4.14–5.80)
RDW: 12.3 % (ref 11.6–15.4)
Sodium: 142 mmol/L (ref 134–144)
T3 Uptake Ratio: 26 % (ref 24–39)
T4, Total: 4.3 ug/dL — ABNORMAL LOW (ref 4.5–12.0)
TSH: 2.8 u[IU]/mL (ref 0.450–4.500)
Total Protein: 7.2 g/dL (ref 6.0–8.5)
Triglycerides: 232 mg/dL — ABNORMAL HIGH (ref 0–149)
Uric Acid: 6.2 mg/dL (ref 3.8–8.4)
VLDL Cholesterol Cal: 40 mg/dL (ref 5–40)
WBC: 3.8 10*3/uL (ref 3.4–10.8)
eGFR: 77 mL/min/{1.73_m2} (ref >59)

## 2020-12-23 LAB — HGB A1C W/O EAG: Hgb A1c MFr Bld: 5.6 % (ref 4.8–5.6)

## 2020-12-29 ENCOUNTER — Other Ambulatory Visit: Payer: Self-pay

## 2020-12-29 ENCOUNTER — Encounter: Payer: Self-pay | Admitting: Nurse Practitioner

## 2020-12-29 ENCOUNTER — Ambulatory Visit: Payer: Self-pay | Admitting: Nurse Practitioner

## 2020-12-29 VITALS — BP 150/90 | HR 79 | Temp 98.0°F | Resp 12 | Ht 77.0 in | Wt 275.0 lb

## 2020-12-29 DIAGNOSIS — N529 Male erectile dysfunction, unspecified: Secondary | ICD-10-CM

## 2020-12-29 DIAGNOSIS — E7849 Other hyperlipidemia: Secondary | ICD-10-CM

## 2020-12-29 MED ORDER — ROSUVASTATIN CALCIUM 20 MG PO TABS: 20.0000 mg | ORAL_TABLET | Freq: Every day | ORAL | 3 refills | Status: DC

## 2020-12-29 MED ORDER — SILDENAFIL CITRATE 100 MG PO TABS: ORAL_TABLET | ORAL | 5 refills | Status: DC

## 2020-12-29 NOTE — Progress Notes (Signed)
Labs & EKG completed 12/22/20.  AMD

## 2020-12-29 NOTE — Progress Notes (Addendum)
Subjective:    Patient ID: Mark Rubio, male    DOB: 1965/12/16, 55 y.o.   MRN: [id number]  HPI  55 year old male presenting to [place] for annual physical. Ongoing lower back pain states muscle relaxer and Naproxen is not helping so he is no longer using anything.   Denies any new health concerns today.  He takes Crestor daily, uses sildenafil on average once a month.    States he had a colonoscopy in 2020, recommended repeat in 5 years  Historically BP has been elevated, he does not measure at home has not been on medication in the past  Denies a history of tobacco use    Today's Vitals   12/29/20 0815  BP: (!) 150/90  Pulse: 79  Resp: 12  Temp: 98 F (36.7 C)  TempSrc: Oral  SpO2: 97%  Weight: 275 lb (124.7 kg)  Height: 6' 5"  (1.956 m)   Body mass index is 32.61 kg/m.   Current Outpatient Medications  Medication Instructions  . cyclobenzaprine (FLEXERIL) 10 mg, Oral, Daily at bedtime  . naproxen (NAPROSYN) 500 mg, Oral, 2 times daily with meals  . orphenadrine (NORFLEX) 100 mg, Oral, Nightly  . rosuvastatin (CRESTOR) 20 MG tablet TAKE 1 TABLET BY MOUTH EVERY DAY  . sildenafil (VIAGRA) 100 MG tablet Take 1 tablet by mouth 30 to 60 minutes prior to sexual activities; max 1 tab per 24 hours    Past Medical History:  Diagnosis Date  . Hyperlipidemia   . Hypothyroidism 07/23/2020   [facility]  . Pre-diabetes    [facility]    Review of Systems  Constitutional: Negative.   HENT: Negative.   Eyes: Negative.   Respiratory: Negative.   Cardiovascular: Negative.   Gastrointestinal: Negative.   Endocrine: Negative.   Genitourinary:       Erectile dysfunction   Musculoskeletal: Positive for back pain.  Neurological: Negative.   Hematological: Negative.   Psychiatric/Behavioral: Negative.        Objective:   Physical Exam Constitutional:      Appearance: Normal appearance.  HENT:     Head: Normocephalic.     Right Ear: External ear normal.      Left Ear: External ear normal.     Nose: Nose normal.     Mouth/Throat:     Mouth: Mucous membranes are moist.  Eyes:     Pupils: Pupils are equal, round, and reactive to light.  Cardiovascular:     Rate and Rhythm: Normal rate and regular rhythm.     Heart sounds: Normal heart sounds.  Pulmonary:     Effort: Pulmonary effort is normal.     Breath sounds: Normal breath sounds.  Abdominal:     Palpations: Abdomen is soft.     Tenderness: There is no right CVA tenderness or left CVA tenderness.  Musculoskeletal:        General: Normal range of motion.     Cervical back: Normal range of motion.  Skin:    General: Skin is warm and dry.  Neurological:     General: No focal deficit present.     Mental Status: He is alert and oriented to person, place, and time.  Psychiatric:        Mood and Affect: Mood normal.        Behavior: Behavior normal.        Thought Content: Thought content normal.        Judgment: Judgment normal.  Recent Results (from the past 2160 hour(s))  CMP12+LP+TP+TSH+6AC+PSA+CBC.     Status: Abnormal   Collection Time: 12/22/20  9:03 AM  Result Value Ref Range   Glucose 121 (H) 65 - 99 mg/dL   Uric Acid 6.2 3.8 - 8.4 mg/dL    Comment:            Therapeutic target for gout patients: <6.0   BUN 12 6 - 24 mg/dL   Creatinine, Ser 1.13 0.76 - 1.27 mg/dL   eGFR 77 >59 mL/min/1.73    Comment: **In accordance with recommendations from the NKF-ASN Task force,**   [facility] has updated its eGFR calculation to the 2021 CKD-EPI   creatinine equation that estimates kidney function without a race   variable.    BUN/Creatinine Ratio 11 9 - 20   Sodium 142 134 - 144 mmol/L   Potassium 4.6 3.5 - 5.2 mmol/L   Chloride 105 96 - 106 mmol/L   Calcium 9.2 8.7 - 10.2 mg/dL   Phosphorus 3.1 2.8 - 4.1 mg/dL   Total Protein 7.2 6.0 - 8.5 g/dL   Albumin 4.6 3.8 - 4.9 g/dL   Globulin, Total 2.6 1.5 - 4.5 g/dL   Albumin/Globulin Ratio 1.8 1.2 - 2.2   Bilirubin Total 1.0  0.0 - 1.2 mg/dL   Alkaline Phosphatase 78 44 - 121 IU/L   LDH 145 121 - 224 IU/L   AST 21 0 - 40 IU/L   ALT 19 0 - 44 IU/L   GGT 38 0 - 65 IU/L   Iron 88 38 - 169 ug/dL   Cholesterol, Total 187 100 - 199 mg/dL   Triglycerides 232 (H) 0 - 149 mg/dL   HDL 44 >39 mg/dL   VLDL Cholesterol Cal 40 5 - 40 mg/dL   LDL Chol Calc (NIH) 103 (H) 0 - 99 mg/dL   Chol/HDL Ratio 4.3 0.0 - 5.0 ratio    Comment:                                   T. Chol/HDL Ratio                                             Men  Women                               1/2 Avg.Risk  3.4    3.3                                   Avg.Risk  5.0    4.4                                2X Avg.Risk  9.6    7.1                                3X Avg.Risk 23.4   11.0    Estimated CHD Risk 0.8 0.0 - 1.0 times avg.    Comment: The CHD Risk is based on the T. Chol/HDL ratio. Other factors affect CHD Risk  such as hypertension, smoking, diabetes, severe obesity, and family history of premature CHD.    TSH 2.800 0.450 - 4.500 uIU/mL   T4, Total 4.3 (L) 4.5 - 12.0 ug/dL   T3 Uptake Ratio 26 24 - 39 %   Free Thyroxine Index 1.1 (L) 1.2 - 4.9   Prostate Specific Ag, Serum 0.3 0.0 - 4.0 ng/mL    Comment: Roche ECLIA methodology. According to the American Urological Association, Serum PSA should decrease and remain at undetectable levels after radical prostatectomy. The AUA defines biochemical recurrence as an initial PSA value 0.2 ng/mL or greater followed by a subsequent confirmatory PSA value 0.2 ng/mL or greater. Values obtained with different assay methods or kits cannot be used interchangeably. Results cannot be interpreted as absolute evidence of the presence or absence of malignant disease.    WBC 3.8 3.4 - 10.8 x10E3/uL   RBC 4.56 4.14 - 5.80 x10E6/uL   Hemoglobin 13.3 13.0 - 17.7 g/dL   Hematocrit 39.2 37.5 - 51.0 %   MCV 86 79 - 97 fL   MCH 29.2 26.6 - 33.0 pg   MCHC 33.9 31.5 - 35.7 g/dL   RDW 12.3 11.6 - 15.4 %    Platelets 158 150 - 450 x10E3/uL   Neutrophils 54 Not Estab. %   Lymphs 29 Not Estab. %   Monocytes 15 Not Estab. %   Eos 1 Not Estab. %   Basos 0 Not Estab. %   Neutrophils Absolute 2.1 1.4 - 7.0 x10E3/uL   Lymphocytes Absolute 1.1 0.7 - 3.1 x10E3/uL   Monocytes Absolute 0.6 0.1 - 0.9 x10E3/uL   EOS (ABSOLUTE) 0.0 0.0 - 0.4 x10E3/uL   Basophils Absolute 0.0 0.0 - 0.2 x10E3/uL   Immature Granulocytes 1 Not Estab. %   Immature Grans (Abs) 0.0 0.0 - 0.1 x10E3/uL  Hgb A1c w/o eAG     Status: None   Collection Time: 12/22/20  9:03 AM  Result Value Ref Range   Hgb A1c MFr Bld 5.6 4.8 - 5.6 %    Comment:          Prediabetes: 5.7 - 6.4          Diabetes: >6.4          Glycemic control for adults with diabetes: <7.0   POCT urinalysis dipstick     Status: Normal   Collection Time: 12/22/20  9:19 AM  Result Value Ref Range   Color, UA yellow    Clarity, UA clear    Glucose, UA Negative Negative   Bilirubin, UA negative    Ketones, UA negative    Spec Grav, UA 1.025 1.010 - 1.025   Blood, UA negative    pH, UA 6.0 5.0 - 8.0   Protein, UA Negative Negative   Urobilinogen, UA 0.2 0.2 or 1.0 E.U./dL   Nitrite, UA negative    Leukocytes, UA Negative Negative   Appearance medium    Odor      Discussed abnormals: 1. Elevated fasting glucose normal A1C 2. Elevated Triglycerides total Cholesterol normal  3. Thyroid function improving from last year- no symptoms normal TSH      Assessment & Plan:  1. Elevated blood pressure. Patient to return to clinic two more times this week for BP check, if consistently elevated will start Norvasc 57m.  Encourages purchasing BP machine from home to monitor  2. Elevated triglycerides. Patient will start OTC fish oil.Omega3 supplement and continue Crestor as prescribed.   3. Elevated Glucose, discussed reducing sugar/carbs  in diet. Patient is motivated to decrease carbohydrate intake, also discussed sodium intake and effects on BP.   4. Lower  back pain, offered PT patient is not interested at this time due to cost. Encouraged stretch and core exercises at home.   Refilled Viagra, RTC for BP checks as discussed and will follow up with plan to start BP medication as necessary.   Colonoscopy repeat recommended for 2025

## 2020-12-29 NOTE — Patient Instructions (Addendum)
https://www.diabeteseducator.org/docs/default-source/living-with-diabetes/conquering-the-grocery-store-v1.pdf?sfvrsn=4">  Carbohydrate Counting Carbohydrate counting is a method of keeping track of how many carbohydrates you eat. Eating carbohydrates naturally increases the amount of sugar (glucose) in the blood. Counting how many carbohydrates you eat improves your blood glucose levels It is important to know how many carbohydrates you can safely have in each meal. This is different for every person.  What foods contain carbohydrates? Carbohydrates are found in the following foods:  Grains, such as breads and cereals.  Dried beans and soy products.  Starchy vegetables, such as potatoes, peas, and corn.  Fruit and fruit juices.  Milk and yogurt.  Sweets and snack foods, such as cake, cookies, candy, chips, and soft drinks.   How do I count carbohydrates in foods? There are two ways to count carbohydrates in food. You can read food labels or learn standard serving sizes of foods. You can use either of the methods or a combination of both. Using the Nutrition Facts label The Nutrition Facts list is included on the labels of almost all packaged foods and beverages in the [place] It includes:  The serving size.  Information about nutrients in each serving, including the grams (g) of carbohydrate per serving. To use the Nutrition Facts:  Decide how many servings you will have.  Multiply the number of servings by the number of carbohydrates per serving.  The resulting number is the total amount of carbohydrates that you will be having. Learning the standard serving sizes of foods When you eat carbohydrate foods that are not packaged or do not include Nutrition Facts on the label, you need to measure the servings in order to count the amount of carbohydrates.  Measure the foods that you will eat with a food scale or measuring cup, if needed.  Decide how many standard-size servings you  will eat.  Multiply the number of servings by 15. For foods that contain carbohydrates, one serving equals 15 g of carbohydrates. ? For example, if you eat 2 cups or 10 oz (300 g) of strawberries, you will have eaten 2 servings and 30 g of carbohydrates (2 servings x 15 g = 30 g).  For foods that have more than one food mixed, such as soups and casseroles, you must count the carbohydrates in each food that is included. The following list contains standard serving sizes of common carbohydrate-rich foods. Each of these servings has about 15 g of carbohydrates:  1 slice of bread.  1 six-inch (15 cm) tortilla.  ? cup or 2 oz (53 g) cooked rice or pasta.   cup or 3 oz (85 g) cooked or canned, drained and rinsed beans or lentils.   cup or 3 oz (85 g) starchy vegetable, such as peas, corn, or squash.   cup or 4 oz (120 g) hot cereal.   cup or 3 oz (85 g) boiled or mashed potatoes, or  or 3 oz (85 g) of a large baked potato.   cup or 4 fl oz (118 mL) fruit juice.  1 cup or 8 fl oz (237 mL) milk.  1 small or 4 oz (106 g) apple.   or 2 oz (63 g) of a medium banana.  1 cup or 5 oz (150 g) strawberries.  3 cups or 1 oz (24 g) popped popcorn. What is an example of carbohydrate counting? To calculate the number of carbohydrates in this sample meal, follow the steps shown below. Sample meal  3 oz (85 g) chicken breast.  ? cup or 4  oz (106 g) brown rice.   cup or 3 oz (85 g) corn.  1 cup or 8 fl oz (237 mL) milk.  1 cup or 5 oz (150 g) strawberries with sugar-free whipped topping. Carbohydrate calculation 1. Identify the foods that contain carbohydrates: ? Rice. ? Corn. ? Milk. ? Strawberries. 2. Calculate how many servings you have of each food: ? 2 servings rice. ? 1 serving corn. ? 1 serving milk. ? 1 serving strawberries. 3. Multiply each number of servings by 15 g: ? 2 servings rice x 15 g = 30 g. ? 1 serving corn x 15 g = 15 g. ? 1 serving milk x 15 g = 15  g. ? 1 serving strawberries x 15 g = 15 g. 4. Add together all of the amounts to find the total grams of carbohydrates eaten: ? 30 g + 15 g + 15 g + 15 g = 75 g of carbohydrates total. What are tips for following this plan? Shopping  Develop a meal plan and then make a shopping list.  Buy fresh and frozen vegetables, fresh and frozen fruit, dairy, eggs, beans, lentils, and whole grains.  Look at food labels. Choose foods that have more fiber and less sugar.  Avoid processed foods and foods with added sugars. Meal planning  Aim to have the same amount of carbohydrates at each meal and for each snack time.  Plan to have regular, balanced meals and snacks. Where to find more information  American Diabetes Association: www.diabetes.org  Centers for Disease Control and Prevention: http://www.wolf.info/ Summary  Carbohydrate counting is a method of keeping track of how many carbohydrates you eat.  Eating carbohydrates naturally increases the amount of sugar (glucose) in the blood.  Counting how many carbohydrates you eat improves your blood glucose control, which helps you manage your diabetes.  A dietitian can help you make a meal plan and calculate how many carbohydrates you should have at each meal and snack. This information is not intended to replace advice given to you by your health care provider. Make sure you discuss any questions you have with your health care provider. Document Revised: 10/10/2019 Document Reviewed: 10/11/2019 Elsevier Patient Education  2021 [employer].  High Cholesterol  High cholesterol is a condition in which the blood has high levels of a white, waxy substance similar to fat (cholesterol). The liver makes all the cholesterol that the body needs. The human body needs small amounts of cholesterol to help build cells. A person gets extra or excess cholesterol from the food that he or she eats. The blood carries cholesterol from the liver to the rest of the  body. If you have high cholesterol, deposits (plaques) may build up on the walls of your arteries. Arteries are the blood vessels that carry blood away from your heart. These plaques make the arteries narrow and stiff. Cholesterol plaques increase your risk for heart attack and stroke. Work with your health care provider to keep your cholesterol levels in a healthy range. What increases the risk? The following factors may make you more likely to develop this condition:  Eating foods that are high in animal fat (saturated fat) or cholesterol.  Being overweight.  Not getting enough exercise.  A family history of high cholesterol (familial hypercholesterolemia).  Use of tobacco products.  Having diabetes. What are the signs or symptoms? There are no symptoms of this condition. How is this diagnosed? This condition may be diagnosed based on the results of a blood  test.  If you are older than 56 years of age, your health care provider may check your cholesterol levels every 4-6 years.  You may be checked more often if you have high cholesterol or other risk factors for heart disease. The blood test for cholesterol measures:  "Bad" cholesterol, or LDL cholesterol. This is the main type of cholesterol that causes heart disease. The desired level is less than 100 mg/dL.  "Good" cholesterol, or HDL cholesterol. HDL helps protect against heart disease by cleaning the arteries and carrying the LDL to the liver for processing. The desired level for HDL is 60 mg/dL or higher.  Triglycerides. These are fats that your body can store or burn for energy. The desired level is less than 150 mg/dL.  Total cholesterol. This measures the total amount of cholesterol in your blood and includes LDL, HDL, and triglycerides. The desired level is less than 200 mg/dL. How is this treated? This condition may be treated with:  Diet changes. You may be asked to eat foods that have more fiber and less saturated  fats or added sugar.  Lifestyle changes. These may include regular exercise, maintaining a healthy weight, and quitting use of tobacco products.  Medicines. These are given when diet and lifestyle changes have not worked. You may be prescribed a statin medicine to help lower your cholesterol levels. Follow these instructions at home: Eating and drinking  Eat a healthy, balanced diet. This diet includes: ? Daily servings of a variety of fresh, frozen, or canned fruits and vegetables. ? Daily servings of whole grain foods that are rich in fiber. ? Foods that are low in saturated fats and trans fats. These include poultry and fish without skin, lean cuts of meat, and low-fat dairy products. ? A variety of fish, especially oily fish that contain omega-3 fatty acids. Aim to eat fish at least 2 times a week.  Avoid foods and drinks that have added sugar.  Use healthy cooking methods, such as roasting, grilling, broiling, baking, poaching, steaming, and stir-frying. Do not fry your food except for stir-frying.   Lifestyle  Get regular exercise. Aim to exercise for a total of 150 minutes a week. Increase your activity level by doing activities such as gardening, walking, and taking the stairs.  Do not use any products that contain nicotine or tobacco, such as cigarettes, e-cigarettes, and chewing tobacco. If you need help quitting, ask your health care provider.   General instructions  Take over-the-counter and prescription medicines only as told by your health care provider.  Keep all follow-up visits as told by your health care provider. This is important. Where to find more information  American Heart Association: www.heart.org  National Heart, Lung, and Blood Institute: https://wilson-eaton.com/ Contact a health care provider if:  You have trouble achieving or maintaining a healthy diet or weight.  You are starting an exercise program.  You are unable to stop smoking. Get help right away  if:  You have chest pain.  You have trouble breathing.  You have any symptoms of a stroke. "BE FAST" is an easy way to remember the main warning signs of a stroke: ? B - Balance. Signs are dizziness, sudden trouble walking, or loss of balance. ? E - Eyes. Signs are trouble seeing or a sudden change in vision. ? F - Face. Signs are sudden weakness or numbness of the face, or the face or eyelid drooping on one side. ? A - Arms. Signs are weakness or numbness in  an arm. This happens suddenly and usually on one side of the body. ? S - Speech. Signs are sudden trouble speaking, slurred speech, or trouble understanding what people say. ? T - Time. Time to call emergency services. Write down what time symptoms started.  You have other signs of a stroke, such as: ? A sudden, severe headache with no known cause. ? Nausea or vomiting. ? Seizure. These symptoms may represent a serious problem that is an emergency. Do not wait to see if the symptoms will go away. Get medical help right away. Call your local emergency services (911 in the [place] Do not drive yourself to the hospital. Summary  Cholesterol plaques increase your risk for heart attack and stroke. Work with your health care provider to keep your cholesterol levels in a healthy range.  Eat a healthy, balanced diet, get regular exercise, and maintain a healthy weight.  Do not use any products that contain nicotine or tobacco, such as cigarettes, e-cigarettes, and chewing tobacco.  Get help right away if you have any symptoms of a stroke. This information is not intended to replace advice given to you by your health care provider. Make sure you discuss any questions you have with your health care provider. Document Revised: 09/09/2019 Document Reviewed: 09/09/2019 Elsevier Patient Education  2021 [employer].

## 2020-12-30 ENCOUNTER — Ambulatory Visit: Payer: Self-pay

## 2020-12-30 VITALS — BP 130/90

## 2020-12-30 DIAGNOSIS — Z0130 Encounter for examination of blood pressure without abnormal findings: Secondary | ICD-10-CM

## 2020-12-31 ENCOUNTER — Ambulatory Visit: Payer: Self-pay

## 2020-12-31 ENCOUNTER — Other Ambulatory Visit: Payer: Self-pay

## 2020-12-31 VITALS — BP 140/92 | HR 62

## 2020-12-31 DIAGNOSIS — Z0130 Encounter for examination of blood pressure without abnormal findings: Secondary | ICD-10-CM

## 2021-04-02 ENCOUNTER — Other Ambulatory Visit: Payer: Self-pay

## 2021-04-02 ENCOUNTER — Encounter: Payer: Self-pay | Admitting: Adult Health

## 2021-04-02 ENCOUNTER — Ambulatory Visit: Payer: Self-pay | Admitting: Adult Health

## 2021-04-02 VITALS — BP 142/67 | HR 97 | Temp 97.6°F | Resp 16 | Ht 77.0 in | Wt 256.0 lb

## 2021-04-02 DIAGNOSIS — N529 Male erectile dysfunction, unspecified: Secondary | ICD-10-CM

## 2021-04-02 DIAGNOSIS — G8929 Other chronic pain: Secondary | ICD-10-CM

## 2021-04-02 MED ORDER — SILDENAFIL CITRATE 20 MG PO TABS: 20.0000 mg | ORAL_TABLET | Freq: Every day | ORAL | 0 refills | Status: DC | PRN

## 2021-04-02 MED ORDER — MELOXICAM 7.5 MG PO TABS: 7.5000 mg | ORAL_TABLET | Freq: Every day | ORAL | 0 refills | Status: DC | PRN

## 2021-04-02 NOTE — Progress Notes (Signed)
[facility] [address], [place] [address]   Office Visit Note  Patient Name: Mark Rubio  [id number]  [id number]  Date of Service: 04/02/2021  Chief Complaint  Patient presents with   Back Pain    Lower back constantly stiff and its been going on for years. CL,RMA     HPI Pt is here for acute visit.  He reports chronic back stiffness especially when he wakes up in the morning.  Some days are worse than others. He has used tylenol and ice intermittently.  He has not tried heat or NSAIDS.  He is also requesting a refill on his sildenafil.      Current Medication:  Outpatient Encounter Medications as of 04/02/2021  Medication Sig   meloxicam (MOBIC) 7.5 MG tablet Take 1 tablet (7.5 mg total) by mouth daily as needed for pain.   rosuvastatin (CRESTOR) 20 MG tablet Take 1 tablet (20 mg total) by mouth daily.   sildenafil (REVATIO) 20 MG tablet Take 1 tablet (20 mg total) by mouth daily as needed.   sildenafil (VIAGRA) 100 MG tablet Take 1 tablet by mouth 30 to 60 minutes prior to sexual activities; max 1 tab per 24 hours   No facility-administered encounter medications on file as of 04/02/2021.      Medical History: Past Medical History:  Diagnosis Date   Hyperlipidemia    Hypothyroidism 07/23/2020   occ health notes [facility]   Pre-diabetes    [facility]     Vital Signs: BP (!) 142/67   Pulse 97   Temp 97.6 F (36.4 C) (Temporal)   Resp 16   Ht 6\' 5"  (1.956 m)   Wt 256 lb (116.1 kg)   SpO2 97%   BMI 30.36 kg/m    Review of Systems  Constitutional:  Negative for activity change and appetite change.  Cardiovascular:  Negative for chest pain, palpitations and leg swelling.  Musculoskeletal:  Positive for back pain and myalgias. Negative for gait problem, joint swelling, neck pain and neck stiffness.   Physical Exam HENT:     Head: Normocephalic.     Nose: Nose normal.  Eyes:     Pupils: Pupils are equal, round, and reactive to light.   Cardiovascular:     Rate and Rhythm: Normal rate.  Musculoskeletal:        General: Tenderness present. No swelling or signs of injury.     Right lower leg: No edema.     Left lower leg: No edema.  Skin:    General: Skin is warm and dry.  Neurological:     Mental Status: He is alert.   Assessment/Plan: 1. Chronic midline low back pain without sciatica Discussed use of Meloxicam as needed.  Also discussed when not using meloxicam, may use Aleve or Advil.  Also discussed The patient is advised to apply heat intermittently (avoid sleeping on heating pad) for 20 minutes on, and 2 hours off.   - meloxicam (MOBIC) 7.5 MG tablet; Take 1 tablet (7.5 mg total) by mouth daily as needed for pain.  Dispense: 30 tablet; Refill: 0  2. Erectile dysfunction, unspecified erectile dysfunction type The patient desires Viagra to treat his erectile dysfunction. History and physical exam has not disclosed any obvious treatable cause of this complaint. He is informed that Viagra is sometimes not covered by insurance. It is available on a fee-for-service cost basis, and is relatively expensive. He can start with 40 mg dose, and increase to  60mg  if necessary. The method of use 1 hour prior to anticipated intercourse is explained. He should not use any more than 3 tablets in a 24 hour period. The side effects of possible headache, flushing, dyspepsia and transient changes in vision have been explained.   The patient is not taking nitrates, and denies he has access to nitrates in any form at any time. I have counseled him that taking Viagra with nitrates of any form can cause death. Additionally, Viagra serum concentrations can be increased by the following: cimetidine, erythromycin, itraconazole or ketoconazole. This patient does not take these drugs, but I have counseled him to avoid Viagra if he does take any of these.  We have also discussed the fact that there have been some deaths in patients after taking Viagra,  felt due to the exertion of intercourse rather than the drug itself. The patient is aware of this, and accepts whatever unknown degree of risk there is in this aspect.  - sildenafil (REVATIO) 20 MG tablet; Take 1 tablet (20 mg total) by mouth daily as needed.  Dispense: 30 tablet; Refill: 0     General Counseling: Malva Cogan understanding of the findings of todays visit and agrees with plan of treatment. I have discussed any further diagnostic evaluation that may be needed or ordered today. We also reviewed his medications today. he has been encouraged to call the office with any questions or concerns that should arise related to todays visit.   No orders of the defined types were placed in this encounter.   Meds ordered this encounter  Medications   sildenafil (REVATIO) 20 MG tablet    Sig: Take 1 tablet (20 mg total) by mouth daily as needed.    Dispense:  30 tablet    Refill:  0    Patient has [place] card.  Please fill 30 tablets.   meloxicam (MOBIC) 7.5 MG tablet    Sig: Take 1 tablet (7.5 mg total) by mouth daily as needed for pain.    Dispense:  30 tablet    Refill:  0    Time spent:30 Minutes    Kendell Bane AGNP-C Nurse Practitioner

## 2021-06-14 ENCOUNTER — Other Ambulatory Visit: Payer: Self-pay

## 2021-06-14 DIAGNOSIS — Z0283 Encounter for blood-alcohol and blood-drug test: Secondary | ICD-10-CM

## 2021-06-14 NOTE — Progress Notes (Signed)
Presents to [facility] for Random DOT Drug Screen.  [facility] Acct #:  [id number] [facility] Specimen #:  [id number]  AMD

## 2021-08-06 ENCOUNTER — Other Ambulatory Visit: Payer: Self-pay

## 2021-08-06 ENCOUNTER — Ambulatory Visit: Payer: Self-pay

## 2021-08-06 DIAGNOSIS — Z0000 Encounter for general adult medical examination without abnormal findings: Secondary | ICD-10-CM

## 2021-08-06 LAB — POCT URINALYSIS DIPSTICK
Bilirubin, UA: NEGATIVE
Blood, UA: NEGATIVE
Glucose, UA: NEGATIVE
Ketones, UA: NEGATIVE
Leukocytes, UA: NEGATIVE
Nitrite, UA: NEGATIVE
Protein, UA: NEGATIVE
Spec Grav, UA: >=1.03 — AB (ref 1.010–1.025)
Urobilinogen, UA: 0.2 E.U./dL
pH, UA: 6 (ref 5.0–8.0)

## 2021-08-07 LAB — CMP12+LP+TP+TSH+6AC+PSA+CBC…
ALT: 17 IU/L (ref 0–44)
AST: 18 IU/L (ref 0–40)
Albumin/Globulin Ratio: 1.9 (ref 1.2–2.2)
Albumin: 4.8 g/dL (ref 3.8–4.9)
Alkaline Phosphatase: 74 IU/L (ref 44–121)
BUN/Creatinine Ratio: 15 (ref 9–20)
BUN: 15 mg/dL (ref 6–24)
Basophils Absolute: 0 10*3/uL (ref 0.0–0.2)
Basos: 0 %
Bilirubin Total: 1.1 mg/dL (ref 0.0–1.2)
Calcium: 9.3 mg/dL (ref 8.7–10.2)
Chloride: 105 mmol/L (ref 96–106)
Chol/HDL Ratio: 3.8 ratio (ref 0.0–5.0)
Cholesterol, Total: 177 mg/dL (ref 100–199)
Creatinine, Ser: 1.03 mg/dL (ref 0.76–1.27)
EOS (ABSOLUTE): 0 10*3/uL (ref 0.0–0.4)
Eos: 1 %
Estimated CHD Risk: 0.7 times avg. (ref 0.0–1.0)
Free Thyroxine Index: 1.2 (ref 1.2–4.9)
GGT: 31 IU/L (ref 0–65)
Globulin, Total: 2.5 g/dL (ref 1.5–4.5)
Glucose: 111 mg/dL — ABNORMAL HIGH (ref 70–99)
HDL: 46 mg/dL (ref >39)
Hematocrit: 39.4 % (ref 37.5–51.0)
Hemoglobin: 13.3 g/dL (ref 13.0–17.7)
Immature Grans (Abs): 0 10*3/uL (ref 0.0–0.1)
Immature Granulocytes: 0 %
Iron: 91 ug/dL (ref 38–169)
LDH: 146 IU/L (ref 121–224)
LDL Chol Calc (NIH): 98 mg/dL (ref 0–99)
Lymphocytes Absolute: 1.2 10*3/uL (ref 0.7–3.1)
Lymphs: 35 %
MCH: 29 pg (ref 26.6–33.0)
MCHC: 33.8 g/dL (ref 31.5–35.7)
MCV: 86 fL (ref 79–97)
Monocytes Absolute: 0.5 10*3/uL (ref 0.1–0.9)
Monocytes: 14 %
Neutrophils Absolute: 1.7 10*3/uL (ref 1.4–7.0)
Neutrophils: 50 %
Phosphorus: 3.2 mg/dL (ref 2.8–4.1)
Platelets: 147 10*3/uL — ABNORMAL LOW (ref 150–450)
Potassium: 4.4 mmol/L (ref 3.5–5.2)
Prostate Specific Ag, Serum: 0.3 ng/mL (ref 0.0–4.0)
RBC: 4.58 x10E6/uL (ref 4.14–5.80)
RDW: 12.4 % (ref 11.6–15.4)
Sodium: 143 mmol/L (ref 134–144)
T3 Uptake Ratio: 30 % (ref 24–39)
T4, Total: 3.9 ug/dL — ABNORMAL LOW (ref 4.5–12.0)
TSH: 2.34 u[IU]/mL (ref 0.450–4.500)
Total Protein: 7.3 g/dL (ref 6.0–8.5)
Triglycerides: 192 mg/dL — ABNORMAL HIGH (ref 0–149)
Uric Acid: 6.2 mg/dL (ref 3.8–8.4)
VLDL Cholesterol Cal: 33 mg/dL (ref 5–40)
WBC: 3.5 10*3/uL (ref 3.4–10.8)
eGFR: 85 mL/min/{1.73_m2} (ref >59)

## 2021-08-07 LAB — HGB A1C W/O EAG: Hgb A1c MFr Bld: 5.5 % (ref 4.8–5.6)

## 2021-08-11 ENCOUNTER — Encounter: Payer: Self-pay | Admitting: Physician Assistant

## 2021-08-11 ENCOUNTER — Other Ambulatory Visit: Payer: Self-pay

## 2021-08-11 ENCOUNTER — Ambulatory Visit: Payer: Self-pay | Admitting: Physician Assistant

## 2021-08-11 VITALS — BP 138/93 | HR 70 | Temp 97.6°F | Resp 14 | Ht 77.0 in | Wt 263.0 lb

## 2021-08-11 DIAGNOSIS — Z0000 Encounter for general adult medical examination without abnormal findings: Secondary | ICD-10-CM

## 2021-08-11 NOTE — Progress Notes (Signed)
   Subjective: Annual physical exam    Patient ID: Mark Rubio, male    DOB: 21-Feb-1966, 55 y.o.   MRN: [id number]  HPI Patient presents for annual physical exam voices no concerns or complaints.   Review of Systems Erectile dysfunction and hyperlipidemia.    Objective:   Physical Exam No acute distress.  Temperature 97.6 pulse 70, respiration 14, BP 138/93, patient 90% O2 sat on room air.  Patient weighs 263 pounds and BMI is 39.19. HEENT is unremarkable.  Neck is supple without adenopathy or bruits.  Lungs are clear to auscultation.  Heart is regular rate and rhythm.  EKG reveals LVH. Abdomen distended secondary to body habitus, normoactive bowel sounds, soft, nontender to palpation. No obvious deformity to the upper or lower extremities.  Patient has full and equal range of motion upper and lower extremities. No obvious cervical or lumbar spine deformity.  Patient has full and equal range of motion cervical lumbar spine. Cranial nerves II through XII grossly intact.  DTR 2+ without clonus.       Assessment & Plan: Well exam   Discussed lab results with patient.  Triglycerides are improving with Crestor.  Advised to continue previous medication and follow-up as needed.

## 2021-08-11 NOTE — Progress Notes (Signed)
Pt denies any issues or concerns at this time./CL,RMA

## 2021-12-30 ENCOUNTER — Other Ambulatory Visit: Payer: Self-pay | Admitting: Physician Assistant

## 2021-12-30 ENCOUNTER — Other Ambulatory Visit: Payer: Self-pay | Admitting: Nurse Practitioner

## 2021-12-30 DIAGNOSIS — E7849 Other hyperlipidemia: Secondary | ICD-10-CM

## 2021-12-30 DIAGNOSIS — N529 Male erectile dysfunction, unspecified: Secondary | ICD-10-CM

## 2022-01-03 ENCOUNTER — Telehealth: Payer: Self-pay

## 2022-01-03 NOTE — Telephone Encounter (Signed)
Elenore Rota called [facility] requesting Rx refill for Viagra 100 mg tablets. ? ?Upon entering Rx refill info into Epic, a pended Rx refill request sent electronically by pharmacy to [facility] ED opened up. ? ?Re-routed the pended Rx refill request for Viagra 100 mg to Randel Pigg, PA-C. ? ?AMD ?

## 2022-01-17 ENCOUNTER — Other Ambulatory Visit: Payer: Self-pay | Admitting: Nurse Practitioner

## 2022-01-17 DIAGNOSIS — E7849 Other hyperlipidemia: Secondary | ICD-10-CM

## 2022-01-19 ENCOUNTER — Other Ambulatory Visit: Payer: Self-pay

## 2022-01-19 ENCOUNTER — Other Ambulatory Visit: Payer: Self-pay | Admitting: Physician Assistant

## 2022-01-19 DIAGNOSIS — E7849 Other hyperlipidemia: Secondary | ICD-10-CM

## 2022-01-19 MED ORDER — ROSUVASTATIN CALCIUM 40 MG PO TABS: 40.0000 mg | ORAL_TABLET | Freq: Every day | ORAL | 3 refills | Status: DC

## 2022-05-10 ENCOUNTER — Telehealth: Payer: Self-pay | Admitting: *Deleted

## 2022-05-10 NOTE — Telephone Encounter (Signed)
Gave pt a 2x2 gauze and piece of coban to pad left toe on foot that was sore.

## 2022-07-18 ENCOUNTER — Ambulatory Visit: Payer: Self-pay

## 2022-07-18 DIAGNOSIS — R7303 Prediabetes: Secondary | ICD-10-CM

## 2022-07-18 DIAGNOSIS — Z0000 Encounter for general adult medical examination without abnormal findings: Secondary | ICD-10-CM

## 2022-07-18 LAB — POCT URINALYSIS DIPSTICK
Bilirubin, UA: NEGATIVE
Blood, UA: NEGATIVE
Glucose, UA: NEGATIVE
Ketones, UA: NEGATIVE
Leukocytes, UA: NEGATIVE
Nitrite, UA: NEGATIVE
Protein, UA: NEGATIVE
Spec Grav, UA: 1.02 (ref 1.010–1.025)
Urobilinogen, UA: 0.2 E.U./dL
pH, UA: 6 (ref 5.0–8.0)

## 2022-07-18 NOTE — Progress Notes (Signed)
Pt presents today for physical labs, will return to clinic for scheduled physical./CL,RMA 

## 2022-07-19 LAB — CMP12+LP+TP+TSH+6AC+PSA+CBC…
ALT: 18 IU/L (ref 0–44)
AST: 19 IU/L (ref 0–40)
Albumin/Globulin Ratio: 1.7 (ref 1.2–2.2)
Albumin: 4.5 g/dL (ref 3.8–4.9)
Alkaline Phosphatase: 70 IU/L (ref 44–121)
BUN/Creatinine Ratio: 12 (ref 9–20)
BUN: 13 mg/dL (ref 6–24)
Basophils Absolute: 0 10*3/uL (ref 0.0–0.2)
Basos: 0 %
Bilirubin Total: 1 mg/dL (ref 0.0–1.2)
Calcium: 9.1 mg/dL (ref 8.7–10.2)
Chloride: 106 mmol/L (ref 96–106)
Chol/HDL Ratio: 2.7 ratio (ref 0.0–5.0)
Cholesterol, Total: 157 mg/dL (ref 100–199)
Creatinine, Ser: 1.1 mg/dL (ref 0.76–1.27)
EOS (ABSOLUTE): 0 10*3/uL (ref 0.0–0.4)
Eos: 1 %
Estimated CHD Risk: <0.5 times avg. (ref 0.0–1.0)
Free Thyroxine Index: 1.2 (ref 1.2–4.9)
GGT: 32 IU/L (ref 0–65)
Globulin, Total: 2.7 g/dL (ref 1.5–4.5)
Glucose: 114 mg/dL — ABNORMAL HIGH (ref 70–99)
HDL: 59 mg/dL (ref >39)
Hematocrit: 39.7 % (ref 37.5–51.0)
Hemoglobin: 13.1 g/dL (ref 13.0–17.7)
Immature Grans (Abs): 0 10*3/uL (ref 0.0–0.1)
Immature Granulocytes: 0 %
Iron: 99 ug/dL (ref 38–169)
LDH: 137 IU/L (ref 121–224)
LDL Chol Calc (NIH): 76 mg/dL (ref 0–99)
Lymphocytes Absolute: 1.1 10*3/uL (ref 0.7–3.1)
Lymphs: 30 %
MCH: 28.8 pg (ref 26.6–33.0)
MCHC: 33 g/dL (ref 31.5–35.7)
MCV: 87 fL (ref 79–97)
Monocytes Absolute: 0.4 10*3/uL (ref 0.1–0.9)
Monocytes: 11 %
Neutrophils Absolute: 2 10*3/uL (ref 1.4–7.0)
Neutrophils: 58 %
Phosphorus: 2.9 mg/dL (ref 2.8–4.1)
Platelets: 144 10*3/uL — ABNORMAL LOW (ref 150–450)
Potassium: 4.1 mmol/L (ref 3.5–5.2)
Prostate Specific Ag, Serum: 0.4 ng/mL (ref 0.0–4.0)
RBC: 4.55 x10E6/uL (ref 4.14–5.80)
RDW: 12.3 % (ref 11.6–15.4)
Sodium: 141 mmol/L (ref 134–144)
T3 Uptake Ratio: 29 % (ref 24–39)
T4, Total: 4.3 ug/dL — ABNORMAL LOW (ref 4.5–12.0)
TSH: 3.41 u[IU]/mL (ref 0.450–4.500)
Total Protein: 7.2 g/dL (ref 6.0–8.5)
Triglycerides: 123 mg/dL (ref 0–149)
Uric Acid: 5.5 mg/dL (ref 3.8–8.4)
VLDL Cholesterol Cal: 22 mg/dL (ref 5–40)
WBC: 3.5 10*3/uL (ref 3.4–10.8)
eGFR: 78 mL/min/{1.73_m2} (ref >59)

## 2022-07-19 LAB — HGB A1C W/O EAG: Hgb A1c MFr Bld: 5.5 % (ref 4.8–5.6)

## 2022-07-21 ENCOUNTER — Ambulatory Visit: Payer: Self-pay | Admitting: Physician Assistant

## 2022-07-21 ENCOUNTER — Encounter: Payer: Self-pay | Admitting: Physician Assistant

## 2022-07-21 VITALS — BP 135/81 | HR 78 | Resp 14 | Ht 77.0 in | Wt 255.0 lb

## 2022-07-21 DIAGNOSIS — Z0000 Encounter for general adult medical examination without abnormal findings: Secondary | ICD-10-CM

## 2022-07-21 NOTE — Progress Notes (Signed)
Pt presents today to complete physical. Pt denies any issues or concerns at this time./CL,RMA 

## 2022-07-21 NOTE — Progress Notes (Signed)
[facility]   ____________________________________________   None    (approximate)  I have reviewed the triage vital signs and the nursing notes.   HISTORY  Chief Complaint Annual Exam   HPI Mark Rubio is a 56 y.o. male patient presents for annual physical exam.  Patient voices no concerns or complaints..  Further history due to patient's not had an exam exam in over 4 years.         Past Medical History:  Diagnosis Date   Hyperlipidemia    Hypothyroidism 07/23/2020   occ health notes [place]    Patient Active Problem List   Diagnosis Date Noted   Hypothyroidism 07/23/2020   Pre-diabetes 01/10/2020   Ankle weakness 01/10/2020   ED (erectile dysfunction) 01/10/2020   Tubular adenoma 07/02/2019   IFG (impaired fasting glucose) 05/21/2019   Elevated BP without diagnosis of hypertension 05/21/2019   Other hyperlipidemia 05/21/2019   Bright red blood per rectum 05/21/2019   Acute low back pain without sciatica 05/21/2019   Class 1 obesity due to excess calories without serious comorbidity with body mass index (BMI) of 31.0 to 31.9 in adult 05/21/2019   Uses alcohol moderately 05/21/2019   Rupture of left patellar tendon 02/14/2016    Past Surgical History:  Procedure Laterality Date   PATELLAR TENDON REPAIR Left 01/01/2016   Procedure: PATELLA TENDON REPAIR;  Surgeon: Dereck Leep, MD;  Location: [facility] ORS;  Service: Orthopedics;  Laterality: Left;    Prior to Admission medications   Medication Sig Start Date End Date Taking? Authorizing Provider  rosuvastatin (CRESTOR) 40 MG tablet Take 1 tablet (40 mg total) by mouth daily. 01/19/22  Yes Sable Feil, PA-C  sildenafil (VIAGRA) 100 MG tablet Take 1 tablet by mouth 30 to 60 minutes prior to sexual activities; max 1 tab per 24 hours 01/03/22  Yes Sable Feil, PA-C    Allergies Patient has no known allergies.  Family History  Problem  Relation Age of Onset   Hypertension Mother    Diabetes Mother    Lung cancer Father     Social History Social History   Tobacco Use   Smoking status: Never   Smokeless tobacco: Never   Tobacco comments:    no passive smokers in home  Substance Use Topics   Alcohol use: Yes    Comment: beer occasionally    Review of Systems Constitutional: No fever/chills Eyes: No visual changes. ENT: No sore throat. Cardiovascular: Denies chest pain. Respiratory: Denies shortness of breath. Gastrointestinal: No abdominal pain.  No nausea, no vomiting.  No diarrhea.  No constipation. Genitourinary: Negative for dysuria. Musculoskeletal: Negative for back pain. Skin: Negative for rash. Neurological: Negative for headaches, focal weakness or numbness. Endocrine: Hyperlipidemia  ____________________________________________   PHYSICAL EXAM:  VITAL SIGNS: BP is 143/91, pulse 73, respiration 14, patient 90% O2 sat on room.  Patient weighs 205 pounds and BMI is 30.24. Constitutional: Alert and oriented. Well appearing and in no acute distress. Eyes: Conjunctivae are normal. PERRL. EOMI. vision acuity is 20/30 OD, 20/20 OS, and bilateral 20/15. Head: Atraumatic. Nose: No congestion/rhinnorhea. Mouth/Throat: Mucous membranes are moist.  Oropharynx non-erythematous. Neck: No stridor.  No cervical spine tenderness to palpation. Hematological/Lymphatic/Immunilogical: No cervical lymphadenopathy. Cardiovascular: Normal rate, regular rhythm. Grossly normal heart sounds.  Good peripheral circulation. Respiratory: Normal respiratory effort.  No retractions. Lungs CTAB. Gastrointestinal: Soft and nontender. No distention. No abdominal bruits. No  CVA tenderness. Genitourinary: Deferred Musculoskeletal: No lower extremity tenderness nor edema.  No joint effusions. Neurologic:  Normal speech and language. No gross focal neurologic deficits are appreciated. No gait instability. Skin:  Skin is warm, dry  and intact. No rash noted. Psychiatric: Mood and affect are normal. Speech and behavior are normal.  ____________________________________________   LABS        Component Ref Range & Units 3 d ago 11 mo ago 1 yr ago 2 yr ago  Color, UA  dark yellow  Amber  yellow  Dark Yellow   Clarity, UA  clear  Clear  clear  Clear   Glucose, UA Negative Negative  Negative  Negative  Negative   Bilirubin, UA  negative  Negative  negative  Negative   Ketones, UA  negative  Negative  negative  Negative   Spec Grav, UA 1.010 - 1.025 1.020  >=1.030 Abnormal   1.025  >=1.030 Abnormal    Blood, UA  negative  Negative  negative  Negative   pH, UA 5.0 - 8.0 6.0  6.0  6.0  5.5   Protein, UA Negative Negative  Negative  Negative  Negative   Urobilinogen, UA 0.2 or 1.0 E.U./dL 0.2  0.2  0.2  0.2   Nitrite, UA  negative  Negative  negative  Negative   Leukocytes, UA Negative Negative  Negative  Negative  Negative   Appearance     medium     Odor                          Other Results from 07/18/2022   Contains abnormal data CMP12+LP+TP+TSH+6AC+PSA+CBC. Order: [id number] Status: Final result    Visible to patient: Yes (not seen)    Next appt: None    Dx: Routine adult health maintenance    0 Result Notes         Component Ref Range & Units 3 d ago 11 mo ago 1 yr ago 2 yr ago 3 yr ago  Glucose 70 - 99 mg/dL 114 High   111 High  CM  121 High  R  117 High  R  103 High  R   Uric Acid 3.8 - 8.4 mg/dL 5.5  6.2 CM  6.2 CM  6.8 CM    Comment:            Therapeutic target for gout patients: <6.0  BUN 6 - 24 mg/dL _0 Creatinine, Ser 0.76 - 1.27 mg/dL 1.10  1.03  1.13  1.31 High   1.22   eGFR >59 mL/min/1.73 78  85  77 CM     BUN/Creatinine Ratio 9 - _1 Sodium 134 - 144 mmol/L 141  143  142  142  141   Potassium 3.5 - 5.2 mmol/L 4.1  4.4  4.6  4.2  4.5   Chloride 96 - 106 mmol/L 106  105  105  105  106   Calcium 8.7 - 10.2 mg/dL 9.1  9.3  9.2  9.4  9.3   Phosphorus  2.8 - 4.1 mg/dL 2.9  3.2  3.1  3.2    Total Protein 6.0 - 8.5 g/dL 7.2  7.3  7.2  7.3    Albumin 3.8 - 4.9 g/dL 4.5  4.8  4.6  4.8    Globulin, Total 1.5 - 4.5 g/dL  2.7  2.5  2.6  2.5    Albumin/Globulin Ratio 1.2 - 2.2 1.7  1.9  1.8  1.9    Bilirubin Total 0.0 - 1.2 mg/dL 1.0  1.1  1.0  1.3 High     Alkaline Phosphatase 44 - 121 IU/L 70  74  78  81 R    LDH 121 - 224 IU/L 137  146  145  149    AST 0 - 40 IU/L _0 ALT 0 - 44 IU/L _1 GGT 0 - 65 IU/L 32  31  38  33    Iron 38 - 169 ug/dL 99  91  88  126    Cholesterol, Total 100 - 199 mg/dL 157  177  187  170    Triglycerides 0 - 149 mg/dL 123  192 High   232 High   194 High     HDL >39 mg/dL 59  46  44  46    VLDL Cholesterol Cal 5 - 40 mg/dL 22  33  40  33    LDL Chol Calc (NIH) 0 - 99 mg/dL 76  98  103 High   91    Chol/HDL Ratio 0.0 - 5.0 ratio 2.7  3.8 CM  4.3 CM  3.7 CM    Comment:                                   T. Chol/HDL Ratio                                              Men  Women                                1/2 Avg.Risk  3.4    3.3                                    Avg.Risk  5.0    4.4                                 2X Avg.Risk  9.6    7.1                                 3X Avg.Risk 23.4   11.0   Estimated CHD Risk 0.0 - 1.0 times avg.  < 0.5  0.7 CM  0.8 CM  0.6 CM    Comment: The CHD Risk is based on the T. Chol/HDL ratio. Other  factors affect CHD Risk such as hypertension, smoking,  diabetes, severe obesity, and family history of  premature CHD.   TSH 0.450 - 4.500 uIU/mL 3.410  2.340  2.800  2.990    T4, Total 4.5 - 12.0 ug/dL 4.3 Low   3.9 Low   4.3 Low   3.8 Low     T3 Uptake Ratio 24 - 39 % _2 29  Free Thyroxine Index 1.2 - 4.9 1.2  1.2  1.1 Low   1.1 Low     Prostate Specific Ag, Serum 0.0 - 4.0 ng/mL 0.4  0.3 CM  0.3 CM  0.6 CM    Comment: Roche ECLIA methodology.  According to the American Urological Association, Serum PSA should  decrease and remain at  undetectable levels after radical  prostatectomy. The AUA defines biochemical recurrence as an initial  PSA value 0.2 ng/mL or greater followed by a subsequent confirmatory  PSA value 0.2 ng/mL or greater.  Values obtained with different assay methods or kits cannot be used  interchangeably. Results cannot be interpreted as absolute evidence  of the presence or absence of malignant disease.   WBC 3.4 - 10.8 x10E3/uL 3.5  3.5  3.8  3.7    RBC 4.14 - 5.80 x10E6/uL 4.55  4.58  4.56  4.67    Hemoglobin 13.0 - 17.7 g/dL 13.1  13.3  13.3  13.4    Hematocrit 37.5 - 51.0 % 39.7  39.4  39.2  40.0    MCV 79 - 97 fL 87  86  86  86    MCH 26.6 - 33.0 pg 28.8  29.0  29.2  28.7    MCHC 31.5 - 35.7 g/dL 33.0  33.8  33.9  33.5    RDW 11.6 - 15.4 % 12.3  12.4  12.3  12.2    Platelets 150 - 450 x10E3/uL 144 Low   147 Low   158  146 Low     Neutrophils Not Estab. % 58  50  54  53    Lymphs Not Estab. % 30  35  29  33    Monocytes Not Estab. % _0 Eos Not Estab. % _1 Basos Not Estab. % 0  0  0  1    Neutrophils Absolute 1.4 - 7.0 x10E3/uL 2.0  1.7  2.1  1.9    Lymphocytes Absolute 0.7 - 3.1 x10E3/uL 1.1  1.2  1.1  1.2    Monocytes Absolute 0.1 - 0.9 x10E3/uL 0.4  0.5  0.6  0.5    EOS (ABSOLUTE) 0.0 - 0.4 x10E3/uL 0.0  0.0  0.0  0.0    Basophils Absolute 0.0 - 0.2 x10E3/uL 0.0  0.0  0.0  0.0    Immature Granulocytes Not Estab. % 0  0  1  0    Immature Grans (Abs) 0.0 - 0.1 x10E3/uL 0.0  0.0  0.0  0.0    Resulting Agency  _2          Narrative Performed byMaryan Puls Performed at:  [facility]  [address] [place], [place]  [id number]  Lab Director: Rush Farmer MD, Phone:  [phone]    Specimen Collected: 07/18/22 08:56 Last Resulted: 07/19/22 10:11      Lab Flowsheet    Order Details    View Encounter    Lab and Collection Details    Routing    Result History    View All Conversations on this Encounter       CM=Additional comments  R=Reference range differs from displayed range      Result Care Coordination   Patient Communication   Add Comments   Add Notifications  Back to Top         Hgb A1c w/o eAG Order: [id number] Status:  Final result    Visible to patient: Yes (not seen)    Next appt: None    Dx: Pre-diabetes    0 Result Notes         Component Ref Range & Units 3 d ago 11 mo ago 1 yr ago 2 yr ago 3 yr ago  Hgb A1c MFr Bld 4.8 - 5.6 % 5.5  5.5 CM  5.6 CM  5.5 CM  5.4 CM   Comment:          Prediabetes: 5.7 - 6.4           Diabetes: >6.4           ____________________________________________  EKG Normal sinus rhythm at 60 bpm  ____________________________________________   ____________________________________________   INITIAL IMPRESSION / ASSESSMENT AND PLAN  As part of my medical decision making, I reviewed the following data within the [facility]      Discussed no acute findings on labs and ECG.        ____________________________________________   FINAL CLINICAL IMPRESSION  Well exam  ED Discharge Orders     None        Note:  This document was prepared using Dragon voice recognition software and may include unintentional dictation errors.

## 2023-01-07 ENCOUNTER — Other Ambulatory Visit: Payer: Self-pay | Admitting: Physician Assistant

## 2023-01-07 DIAGNOSIS — N529 Male erectile dysfunction, unspecified: Secondary | ICD-10-CM

## 2023-01-08 ENCOUNTER — Other Ambulatory Visit: Payer: Self-pay | Admitting: Physician Assistant

## 2023-01-08 DIAGNOSIS — E7849 Other hyperlipidemia: Secondary | ICD-10-CM

## 2023-02-10 ENCOUNTER — Other Ambulatory Visit: Payer: Self-pay | Admitting: Physician Assistant

## 2023-02-10 ENCOUNTER — Other Ambulatory Visit: Payer: Self-pay

## 2023-02-10 DIAGNOSIS — E7849 Other hyperlipidemia: Secondary | ICD-10-CM

## 2023-02-10 MED ORDER — ROSUVASTATIN CALCIUM 40 MG PO TABS: 40.0000 mg | ORAL_TABLET | Freq: Every day | ORAL | 3 refills | Status: DC

## 2023-02-10 NOTE — Telephone Encounter (Signed)
[facility] called requesting Rx refill for Rosuvastatin.  States hasn't taken in 2 weeks.  Pharmacy sent an electronic Rx refill request.  Routed the refill request already in Epic to [employer], PA-C.  AMD

## 2023-03-09 ENCOUNTER — Other Ambulatory Visit: Payer: Self-pay

## 2023-03-09 DIAGNOSIS — Z021 Encounter for pre-employment examination: Secondary | ICD-10-CM

## 2023-03-09 NOTE — Progress Notes (Signed)
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  [facility] Acct #:  [id number] [facility] Specimen #:  [id number]  Rapid drug screen results = Negative  AMD

## 2023-05-04 ENCOUNTER — Other Ambulatory Visit: Payer: Self-pay

## 2023-05-04 DIAGNOSIS — N529 Male erectile dysfunction, unspecified: Secondary | ICD-10-CM

## 2023-05-05 MED ORDER — SILDENAFIL CITRATE 100 MG PO TABS: ORAL_TABLET | ORAL | 5 refills | Status: DC

## 2023-07-13 ENCOUNTER — Encounter: Payer: 59 | Admitting: Physician Assistant

## 2023-08-11 ENCOUNTER — Ambulatory Visit: Payer: Self-pay

## 2023-08-11 DIAGNOSIS — Z0000 Encounter for general adult medical examination without abnormal findings: Secondary | ICD-10-CM

## 2023-08-11 LAB — POCT URINALYSIS DIPSTICK
Bilirubin, UA: NEGATIVE
Blood, UA: NEGATIVE
Glucose, UA: NEGATIVE
Ketones, UA: NEGATIVE
Leukocytes, UA: NEGATIVE
Nitrite, UA: NEGATIVE
Protein, UA: NEGATIVE
Spec Grav, UA: 1.025 (ref 1.010–1.025)
Urobilinogen, UA: 0.2 U/dL
pH, UA: 6 (ref 5.0–8.0)

## 2023-08-11 NOTE — Progress Notes (Signed)
Pt presents today to complete labs for physical. Mark Rubio

## 2023-08-12 LAB — CMP12+LP+TP+TSH+6AC+PSA+CBC…
ALT: 18 [IU]/L (ref 0–44)
AST: 22 [IU]/L (ref 0–40)
Albumin: 4.9 g/dL (ref 3.8–4.9)
Alkaline Phosphatase: 76 [IU]/L (ref 44–121)
BUN/Creatinine Ratio: 13 (ref 9–20)
BUN: 15 mg/dL (ref 6–24)
Basophils Absolute: 0 10*3/uL (ref 0.0–0.2)
Basos: 1 %
Bilirubin Total: 1.3 mg/dL — ABNORMAL HIGH (ref 0.0–1.2)
Calcium: 9.6 mg/dL (ref 8.7–10.2)
Chloride: 100 mmol/L (ref 96–106)
Chol/HDL Ratio: 2.7 {ratio} (ref 0.0–5.0)
Cholesterol, Total: 155 mg/dL (ref 100–199)
Creatinine, Ser: 1.19 mg/dL (ref 0.76–1.27)
EOS (ABSOLUTE): 0.1 10*3/uL (ref 0.0–0.4)
Eos: 1 %
Estimated CHD Risk: <0.5 times avg. (ref 0.0–1.0)
Free Thyroxine Index: 1.4 (ref 1.2–4.9)
GGT: 34 [IU]/L (ref 0–65)
Globulin, Total: 2.8 g/dL (ref 1.5–4.5)
Glucose: 117 mg/dL — ABNORMAL HIGH (ref 70–99)
HDL: 57 mg/dL (ref >39)
Hematocrit: 41.4 % (ref 37.5–51.0)
Hemoglobin: 13.5 g/dL (ref 13.0–17.7)
Immature Grans (Abs): 0 10*3/uL (ref 0.0–0.1)
Immature Granulocytes: 0 %
Iron: 90 ug/dL (ref 38–169)
LDH: 169 [IU]/L (ref 121–224)
LDL Chol Calc (NIH): 74 mg/dL (ref 0–99)
Lymphocytes Absolute: 1.1 10*3/uL (ref 0.7–3.1)
Lymphs: 30 %
MCH: 29.4 pg (ref 26.6–33.0)
MCHC: 32.6 g/dL (ref 31.5–35.7)
MCV: 90 fL (ref 79–97)
Monocytes Absolute: 0.4 10*3/uL (ref 0.1–0.9)
Monocytes: 13 %
Neutrophils Absolute: 1.9 10*3/uL (ref 1.4–7.0)
Neutrophils: 55 %
Phosphorus: 3.3 mg/dL (ref 2.8–4.1)
Platelets: 174 10*3/uL (ref 150–450)
Potassium: 4.3 mmol/L (ref 3.5–5.2)
Prostate Specific Ag, Serum: 0.4 ng/mL (ref 0.0–4.0)
RBC: 4.59 x10E6/uL (ref 4.14–5.80)
RDW: 12 % (ref 11.6–15.4)
Sodium: 140 mmol/L (ref 134–144)
T3 Uptake Ratio: 31 % (ref 24–39)
T4, Total: 4.4 ug/dL — ABNORMAL LOW (ref 4.5–12.0)
TSH: 2.31 u[IU]/mL (ref 0.450–4.500)
Total Protein: 7.7 g/dL (ref 6.0–8.5)
Triglycerides: 141 mg/dL (ref 0–149)
Uric Acid: 6.6 mg/dL (ref 3.8–8.4)
VLDL Cholesterol Cal: 24 mg/dL (ref 5–40)
WBC: 3.5 10*3/uL (ref 3.4–10.8)
eGFR: 71 mL/min/{1.73_m2} (ref >59)

## 2023-08-15 ENCOUNTER — Encounter: Payer: Self-pay | Admitting: Physician Assistant

## 2023-08-15 ENCOUNTER — Ambulatory Visit: Payer: Self-pay | Admitting: Physician Assistant

## 2023-08-15 VITALS — BP 140/84 | HR 75 | Resp 16 | Ht 77.0 in | Wt 258.0 lb

## 2023-08-15 DIAGNOSIS — Z0000 Encounter for general adult medical examination without abnormal findings: Secondary | ICD-10-CM

## 2023-08-15 LAB — HGB A1C W/O EAG: Hgb A1c MFr Bld: 5.7 % — ABNORMAL HIGH (ref 4.8–5.6)

## 2023-08-15 LAB — SPECIMEN STATUS REPORT

## 2023-08-15 NOTE — Progress Notes (Signed)
Pt presents today to complete physical, Pt denies any issues or concerns. Mark Rubio

## 2023-08-15 NOTE — Progress Notes (Signed)
[facility] ____________________________________________   None    (approximate)  I have reviewed the triage vital signs and the nursing notes.   HISTORY  Chief Complaint No chief complaint on file.   HPI Mark Rubio is a 57 y.o. male patient presents for annual physical exam.  Voices no concerns or complaints.         Past Medical History:  Diagnosis Date   Hyperlipidemia    Hypothyroidism 07/23/2020   [facility]   Pre-diabetes    [facility]    Patient Active Problem List   Diagnosis Date Noted   Hypothyroidism 07/23/2020   Pre-diabetes 01/10/2020   Ankle weakness 01/10/2020   ED (erectile dysfunction) 01/10/2020   Tubular adenoma 07/02/2019   IFG (impaired fasting glucose) 05/21/2019   Elevated BP without diagnosis of hypertension 05/21/2019   Other hyperlipidemia 05/21/2019   Bright red blood per rectum 05/21/2019   Acute low back pain without sciatica 05/21/2019   Class 1 obesity due to excess calories without serious comorbidity with body mass index (BMI) of 31.0 to 31.9 in adult 05/21/2019   Uses alcohol moderately 05/21/2019   Rupture of left patellar tendon 02/14/2016    Past Surgical History:  Procedure Laterality Date   PATELLAR TENDON REPAIR Left 01/01/2016   Procedure: PATELLA TENDON REPAIR;  Surgeon: Donato Heinz, MD;  Location: [facility] ORS;  Service: Orthopedics;  Laterality: Left;    Prior to Admission medications   Medication Sig Start Date End Date Taking? Authorizing Provider  rosuvastatin (CRESTOR) 40 MG tablet Take 1 tablet (40 mg total) by mouth daily. 02/10/23  Yes Joni Reining, PA-C  sildenafil (VIAGRA) 100 MG tablet Take 1 tablet by mouth 30 to 60 minutes prior to sexual activities; max 1 tab per 24 hours 05/05/23  Yes Joni Reining, PA-C    Allergies Patient has no known allergies.  Family History  Problem Relation Age of Onset   Hypertension Mother    Diabetes Mother     Lung cancer Father     Social History Social History   Tobacco Use   Smoking status: Never   Smokeless tobacco: Never   Tobacco comments:    no passive smokers in home  Substance Use Topics   Alcohol use: Yes    Comment: beer occasionally    Review of Systems Constitutional: No fever/chills Eyes: No visual changes. ENT: No sore throat. Cardiovascular: Denies chest pain. Respiratory: Denies shortness of breath. Gastrointestinal: No abdominal pain.  No nausea, no vomiting.  No diarrhea.  No constipation. Genitourinary: Negative for dysuria. Musculoskeletal: Negative for back pain. Skin: Negative for rash. Neurological: Negative for headaches, focal weakness or numbness. Endocrine: Hyperlipidemia, hypothyroidism, and prediabetes. ____________________________________________   PHYSICAL EXAM:  VITAL SIGNS: BP 140/84  Pulse 75  Resp 16  SpO2 95 %  Weight 258 lb (117 kg)  Height 6\' 5"  (1.956 m)   BMI 30.59 kg/m2  BSA 2.52 m2   Constitutional: Alert and oriented. Well appearing and in no acute distress. Eyes: Conjunctivae are normal. PERRL. EOMI. Head: Atraumatic. Nose: No congestion/rhinnorhea. Mouth/Throat: Mucous membranes are moist.  Oropharynx non-erythematous. Neck: No stridor.  No cervical spine tenderness to palpation. Hematological/Lymphatic/Immunilogical: No cervical lymphadenopathy. Cardiovascular: Normal rate, regular rhythm. Grossly normal heart sounds.  Good peripheral circulation. Respiratory: Normal respiratory effort.  No retractions. Lungs CTAB. Gastrointestinal: Soft and nontender. No distention. No abdominal bruits. No CVA tenderness. Genitourinary: Deferred Musculoskeletal: No lower extremity tenderness nor  edema.  No joint effusions. Neurologic:  Normal speech and language. No gross focal neurologic deficits are appreciated. No gait instability. Skin:  Skin is warm, dry and intact. No rash noted. Psychiatric: Mood and affect are normal. Speech  and behavior are normal.  ____________________________________________   LABS         Component Ref Range & Units 4 d ago (08/11/23) 1 yr ago (07/18/22) 2 yr ago (08/06/21) 2 yr ago (12/22/20) 3 yr ago (01/13/20)  Color, UA Dark Yellow dark yellow Amber yellow Dark Yellow  Clarity, UA Clear clear Clear clear Clear  Glucose, UA Negative Negative Negative Negative Negative Negative  Bilirubin, UA Negative negative Negative negative Negative  Ketones, UA Negative negative Negative negative Negative  Spec Grav, UA 1.010 - 1.025 1.025 1.020 >=1.030 Abnormal  1.025 >=1.030 Abnormal   Blood, UA Negative negative Negative negative Negative  pH, UA 5.0 - 8.0 6.0 6.0 6.0 6.0 5.5  Protein, UA Negative Negative Negative Negative Negative Negative  Urobilinogen, UA 0.2 or 1.0 E.U./dL 0.2 0.2 0.2 0.2 0.2  Nitrite, UA Negative negative Negative negative Negative  Leukocytes, UA Negative Negative Negative Negative Negative Negative  Appearance    medium   Odor                                Component Ref Range & Units 4 d ago (08/11/23) 1 yr ago (07/18/22) 2 yr ago (08/06/21) 2 yr ago (12/22/20) 3 yr ago (01/13/20) 4 yr ago (06/11/19)  Glucose 70 - 99 mg/dL 564 High  332 High  951 High  CM 121 High  R 117 High  R 103 High  R  Uric Acid 3.8 - 8.4 mg/dL 6.6 5.5 CM 6.2 CM 6.2 CM 6.8 CM   Comment:            Therapeutic target for gout patients: <6.0  BUN 6 - 24 mg/dL 15 13 15 12 14 11   Creatinine, Ser 0.76 - 1.27 mg/dL 8.84 1.66 0.63 0.16 0.10 High  1.22  eGFR >59 mL/min/1.73 71 78 85 77 CM    BUN/Creatinine Ratio 9 - 20 13 12 15 11 11 9   Sodium 134 - 144 mmol/L 140 141 143 142 142 141  Potassium 3.5 - 5.2 mmol/L 4.3 4.1 4.4 4.6 4.2 4.5  Chloride 96 - 106 mmol/L 100 106 105 105 105 106  Calcium 8.7 - 10.2 mg/dL 9.6 9.1 9.3 9.2 9.4 9.3  Phosphorus 2.8 - 4.1 mg/dL 3.3 2.9 3.2 3.1 3.2   Total Protein 6.0 - 8.5 g/dL 7.7 7.2 7.3 7.2 7.3   Albumin 3.8 - 4.9 g/dL 4.9 4.5  4.8 4.6 4.8   Globulin, Total 1.5 - 4.5 g/dL 2.8 2.7 2.5 2.6 2.5   Bilirubin Total 0.0 - 1.2 mg/dL 1.3 High  1.0 1.1 1.0 1.3 High    Alkaline Phosphatase 44 - 121 IU/L 76 70 74 78 81 R   LDH 121 - 224 IU/L 169 137 146 145 149   AST 0 - 40 IU/L 22 19 18 21 18    ALT 0 - 44 IU/L 18 18 17 19 19    GGT 0 - 65 IU/L 34 32 31 38 33   Iron 38 - 169 ug/dL 90 99 91 88 932   Cholesterol, Total 100 - 199 mg/dL 355 732 202 542 706   Triglycerides 0 - 149 mg/dL 237 628 315 High  176 High  194  High    HDL >39 mg/dL 57 59 46 44 46   VLDL Cholesterol Cal 5 - 40 mg/dL 24 22 33 40 33   LDL Chol Calc (NIH) 0 - 99 mg/dL 74 76 98 528 High  91   Chol/HDL Ratio 0.0 - 5.0 ratio 2.7 2.7 CM 3.8 CM 4.3 CM 3.7 CM   Comment:                                   T. Chol/HDL Ratio                                             Men  Women                               1/2 Avg.Risk  3.4    3.3                                   Avg.Risk  5.0    4.4                                2X Avg.Risk  9.6    7.1                                3X Avg.Risk 23.4   11.0  Estimated CHD Risk 0.0 - 1.0 times avg.  < 0.5  < 0.5 CM 0.7 CM 0.8 CM 0.6 CM   Comment: The CHD Risk is based on the T. Chol/HDL ratio. Other factors affect CHD Risk such as hypertension, smoking, diabetes, severe obesity, and family history of premature CHD.  TSH 0.450 - 4.500 uIU/mL 2.310 3.410 2.340 2.800 2.990   T4, Total 4.5 - 12.0 ug/dL 4.4 Low  4.3 Low  3.9 Low  4.3 Low  3.8 Low    T3 Uptake Ratio 24 - 39 % 31 29 30 26 29    Free Thyroxine Index 1.2 - 4.9 1.4 1.2 1.2 1.1 Low  1.1 Low    Prostate Specific Ag, Serum 0.0 - 4.0 ng/mL 0.4 0.4 CM 0.3 CM 0.3 CM 0.6 CM   Comment: Roche ECLIA methodology. According to the American Urological Association, Serum PSA should decrease and remain at undetectable levels after radical prostatectomy. The AUA defines biochemical recurrence as an initial PSA value 0.2 ng/mL or greater followed by a subsequent  confirmatory PSA value 0.2 ng/mL or greater. Values obtained with different assay methods or kits cannot be used interchangeably. Results cannot be interpreted as absolute evidence of the presence or absence of malignant disease.  WBC 3.4 - 10.8 x10E3/uL 3.5 3.5 3.5 3.8 3.7   RBC 4.14 - 5.80 x10E6/uL 4.59 4.55 4.58 4.56 4.67   Hemoglobin 13.0 - 17.7 g/dL 41.3 24.4 01.0 27.2 53.6   Hematocrit 37.5 - 51.0 % 41.4 39.7 39.4 39.2 40.0   MCV 79 - 97 fL 90 87 86 86 86   MCH 26.6 - 33.0 pg 29.4 28.8 29.0 29.2 28.7   MCHC 31.5 - 35.7 g/dL 64.4 03.4 74.2 59.5 63.8  RDW 11.6 - 15.4 % 12.0 12.3 12.4 12.3 12.2   Platelets 150 - 450 x10E3/uL 174 144 Low  147 Low  158 146 Low    Neutrophils Not Estab. % 55 58 50 54 53   Lymphs Not Estab. % 30 30 35 29 33   Monocytes Not Estab. % 13 11 14 15 12    Eos Not Estab. % 1 1 1 1 1    Basos Not Estab. % 1 0 0 0 1   Neutrophils Absolute 1.4 - 7.0 x10E3/uL 1.9 2.0 1.7 2.1 1.9   Lymphocytes Absolute 0.7 - 3.1 x10E3/uL 1.1 1.1 1.2 1.1 1.2   Monocytes Absolute 0.1 - 0.9 x10E3/uL 0.4 0.4 0.5 0.6 0.5   EOS (ABSOLUTE) 0.0 - 0.4 x10E3/uL 0.1 0.0 0.0 0.0 0.0   Basophils Absolute 0.0 - 0.2 x10E3/uL 0.0 0.0 0.0 0.0 0.0   Immature Granulocytes Not Estab. % 0 0 0 1 0   Immature Grans (Abs) 0.0 - 0.1 x10E3/uL 0.0 0.0 0.0 0.0 0.0   Resulting Agency [facility]                 View All Conversations on this Encounter                           Component Ref Range & Units 4 d ago (08/11/23) 1 yr ago (07/18/22) 2 yr ago (08/06/21) 2 yr ago (12/22/20) 3 yr ago (01/13/20) 4 yr ago (06/11/19)  Hgb A1c MFr Bld 4.8 - 5.6 % 5.7 High  5.5 CM 5.5 CM 5.6 CM 5.5 CM 5.4 CM  Comment:          Prediabetes: 5.7 - 6.4          Diabetes: >6.4          ____________________________________________  EKG  Sinus rhythm at 61  bpm ____________________________________________     ____________________________________________   INITIAL IMPRESSION / ASSESSMENT AND PLAN   As part of my medical decision making, I reviewed the following data within the electronic MEDICAL RECORD NUMBER       No acute findings on physical exam, EKG, or labs.     ____________________________________________   FINAL CLINICAL IMPRESSION  Well exam   ED Discharge Orders     None        Note:  This document was prepared using Dragon voice recognition software and may include unintentional dictation errors.

## 2023-10-24 ENCOUNTER — Other Ambulatory Visit: Payer: Self-pay

## 2023-10-24 DIAGNOSIS — N529 Male erectile dysfunction, unspecified: Secondary | ICD-10-CM

## 2023-10-24 DIAGNOSIS — E7849 Other hyperlipidemia: Secondary | ICD-10-CM

## 2023-10-26 MED ORDER — SILDENAFIL CITRATE 100 MG PO TABS
ORAL_TABLET | ORAL | 5 refills | Status: DC
Start: 2023-10-26 — End: 2024-01-24

## 2023-10-26 MED ORDER — ROSUVASTATIN CALCIUM 40 MG PO TABS
40.0000 mg | ORAL_TABLET | Freq: Every day | ORAL | 3 refills | Status: DC
Start: 2023-10-26 — End: 2024-05-08

## 2024-01-17 ENCOUNTER — Encounter: Payer: Self-pay | Admitting: Physician Assistant

## 2024-01-17 ENCOUNTER — Ambulatory Visit: Payer: Self-pay | Admitting: Physician Assistant

## 2024-01-17 VITALS — BP 151/88 | HR 82 | Temp 97.3°F | Resp 12

## 2024-01-17 DIAGNOSIS — M25572 Pain in left ankle and joints of left foot: Secondary | ICD-10-CM

## 2024-01-17 MED ORDER — NAPROXEN 500 MG PO TABS: 500.0000 mg | ORAL_TABLET | Freq: Two times a day (BID) | ORAL | 0 refills | Status: DC

## 2024-01-17 NOTE — Progress Notes (Signed)
   Subjective: Left ankle pain    Patient ID: Mark Rubio, male    DOB: Nov 19, 1965, 58 y.o.   MRN: [id number]  HPI Patient completed of intermitting left lateral ankle pain for 7 to 8 years.  Onset complaint was after left knee surgery.  Pain increased with physical activities.  He denies loss sensation or function.  No other provocative incident for complaint.  No palliative measure for complaint   Review of Systems IFG, hypothyroidism, elevated blood pressure, and prediabetes.    Objective:   Physical Exam BP 151/88BP. 151/88. Data is abnormal. Taken on 01/17/24 1:42 PM  BP Location Left Arm  Patient Position Sitting  Cuff Size Large  Pulse Rate 82  Temp 97.3 F (36.3 C)Temp. 97.3 F (36.3 C). Data is abnormal. Taken on 01/17/24 1:42 PM  Temp Source Temporal  Resp 12  SpO2 98 %  No acute distress. Pes planus.  No obvious deformity.  Neurovascular intact.  Free and equal range of motion.       Assessment & Plan: Chronic left ankle pain  Advised to trial NSAIDs we will obtain imaging.  Patient will follow-up in 1 week.

## 2024-01-17 NOTE — Progress Notes (Signed)
 Left ankle pain intermittently since having left knee surgery 7-8 years ago.  Yesterday evening put weight on left ankle & felt a little twitch.  Hasn't taken any OTC tylenol or ibuprofen, icing or using a ankle support.  Not hurting at this time.

## 2024-01-18 ENCOUNTER — Ambulatory Visit
Admission: RE | Admit: 2024-01-18 | Discharge: 2024-01-18 | Disposition: A | Discharge status: Home / Self Care | Source: Ambulatory Visit | Attending: Physician Assistant | Admitting: Physician Assistant

## 2024-01-18 ENCOUNTER — Ambulatory Visit
Admission: RE | Admit: 2024-01-18 | Discharge: 2024-01-18 | Disposition: A | Discharge status: Home / Self Care | Attending: Physician Assistant | Admitting: Physician Assistant

## 2024-01-18 DIAGNOSIS — M25572 Pain in left ankle and joints of left foot: Secondary | ICD-10-CM

## 2024-01-24 ENCOUNTER — Ambulatory Visit: Payer: Self-pay | Admitting: Physician Assistant

## 2024-01-24 ENCOUNTER — Encounter: Payer: Self-pay | Admitting: Physician Assistant

## 2024-01-24 DIAGNOSIS — M7732 Calcaneal spur, left foot: Secondary | ICD-10-CM

## 2024-01-24 MED ORDER — TADALAFIL 10 MG PO TABS
10.0000 mg | ORAL_TABLET | ORAL | 1 refills | Status: DC | PRN
Start: 2024-01-24 — End: 2024-05-30

## 2024-01-24 NOTE — Progress Notes (Signed)
   Subjective: Chronic left ankle pain    Patient ID: Mark Rubio, male    DOB: 06/14/1966, 58 y.o.   MRN: [id number]  HPI  Patient follow-up for chronic left ankle pain.  No specific provocative incident for complaint.  Patient returns today to discuss x-ray results.  States no change in complaint.  Able to perform full duties.  Requests replacement for Viagra for erectile dysfunction.  State medicine not effective.  Review of Systems Hypothyroidism, prediabetes, and erectile dysfunction.    Objective:   Physical Exam  Physical exam deferred.  Discussed x-ray findings consistent with small heel spur.      Assessment & Plan: Left ankle pain   Patient is amenable to conservative treatment consisting of a heel pad and anti-inflammatory medication.  Advised to follow-up if no improvement or worsening complaint.

## 2024-01-24 NOTE — Progress Notes (Signed)
 Pt presents today for LFT ankle pain follow up. Pt states no change in pain since last visit. Gretel Acre

## 2024-05-08 ENCOUNTER — Other Ambulatory Visit: Payer: Self-pay

## 2024-05-08 DIAGNOSIS — E7849 Other hyperlipidemia: Secondary | ICD-10-CM

## 2024-05-08 MED ORDER — ROSUVASTATIN CALCIUM 40 MG PO TABS: 40.0000 mg | ORAL_TABLET | Freq: Every day | ORAL | 3 refills | Status: DC

## 2024-05-28 ENCOUNTER — Ambulatory Visit: Payer: Self-pay | Admitting: Physician Assistant

## 2024-05-28 DIAGNOSIS — S6391XA Sprain of unspecified part of right wrist and hand, initial encounter: Secondary | ICD-10-CM

## 2024-05-28 NOTE — Progress Notes (Signed)
   Subjective: Right hand pain    Patient ID: Mark Rubio, male    DOB: [birth year], 58 y.o.   MRN: [id number]  HPI Patient complaining of pain to the dorsal aspect of his right hand for approximately 1 week.  Patient also stated he noticed swelling to the dorsal aspect of the right hand.  Patient job requires repetitive pushing of Paramedic.  Patient is right-hand dominant.  Today patient states pain and swelling has decreased.  Patient was concern for recurrence.   Review of Systems Hyperlipidemia and erectile dysfunction.    Objective:   Physical Exam  No acute distress. No obvious deformity, ecchymosis, or edema to the right hand.  Neurovascular intact with free and equal range of motion.      Assessment & Plan: Right hand sprain   Advised over-the-counter anti-inflammatory medication.  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  Follow-up if condition worsens.

## 2024-05-28 NOTE — Progress Notes (Signed)
 Pt presents today with right hand strain. 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 Right middle knuckle swelled for about a week with burning sensation. As of today no pain nor swelling but he hasn't used his hand as much either.

## 2024-05-30 ENCOUNTER — Other Ambulatory Visit: Payer: Self-pay

## 2024-05-30 DIAGNOSIS — N529 Male erectile dysfunction, unspecified: Secondary | ICD-10-CM

## 2024-05-30 MED ORDER — TADALAFIL 10 MG PO TABS: 10.0000 mg | ORAL_TABLET | ORAL | 5 refills | Status: AC | PRN

## 2024-08-14 ENCOUNTER — Ambulatory Visit: Payer: Self-pay

## 2024-08-14 DIAGNOSIS — Z0000 Encounter for general adult medical examination without abnormal findings: Secondary | ICD-10-CM

## 2024-08-14 LAB — POCT URINALYSIS DIPSTICK
Bilirubin, UA: NEGATIVE
Blood, UA: NEGATIVE
Glucose, UA: NEGATIVE
Ketones, UA: NEGATIVE
Leukocytes, UA: NEGATIVE
Nitrite, UA: NEGATIVE
Protein, UA: NEGATIVE
Spec Grav, UA: 1.015 (ref 1.010–1.025)
Urobilinogen, UA: 0.2 U/dL
pH, UA: 5 (ref 5.0–8.0)

## 2024-08-15 LAB — CMP12+LP+TP+TSH+6AC+PSA+CBC…
ALT: 18 IU/L (ref 0–44)
AST: 22 IU/L (ref 0–40)
Albumin: 4.6 g/dL (ref 3.8–4.9)
Alkaline Phosphatase: 73 IU/L (ref 47–123)
BUN/Creatinine Ratio: 15 (ref 9–20)
BUN: 16 mg/dL (ref 6–24)
Basophils Absolute: 0 x10E3/uL (ref 0.0–0.2)
Basos: 0 %
Bilirubin Total: 1.1 mg/dL (ref 0.0–1.2)
Calcium: 9.4 mg/dL (ref 8.7–10.2)
Chloride: 104 mmol/L (ref 96–106)
Chol/HDL Ratio: 3.4 ratio (ref 0.0–5.0)
Cholesterol, Total: 178 mg/dL (ref 100–199)
Creatinine, Ser: 1.07 mg/dL (ref 0.76–1.27)
EOS (ABSOLUTE): 0 x10E3/uL (ref 0.0–0.4)
Eos: 1 %
Estimated CHD Risk: 0.5 times avg. (ref 0.0–1.0)
Free Thyroxine Index: 1.2 (ref 1.2–4.9)
GGT: 33 IU/L (ref 0–65)
Globulin, Total: 2.8 g/dL (ref 1.5–4.5)
Glucose: 116 mg/dL — ABNORMAL HIGH (ref 70–99)
HDL: 53 mg/dL (ref >39)
Hematocrit: 39.4 % (ref 37.5–51.0)
Hemoglobin: 13.2 g/dL (ref 13.0–17.7)
Immature Grans (Abs): 0 x10E3/uL (ref 0.0–0.1)
Immature Granulocytes: 0 %
Iron: 53 ug/dL (ref 38–169)
LDH: 196 IU/L (ref 121–224)
LDL Chol Calc (NIH): 93 mg/dL (ref 0–99)
Lymphocytes Absolute: 1.2 x10E3/uL (ref 0.7–3.1)
Lymphs: 22 %
MCH: 29.9 pg (ref 26.6–33.0)
MCHC: 33.5 g/dL (ref 31.5–35.7)
MCV: 89 fL (ref 79–97)
Monocytes Absolute: 0.5 x10E3/uL (ref 0.1–0.9)
Monocytes: 9 %
Neutrophils Absolute: 3.7 x10E3/uL (ref 1.4–7.0)
Neutrophils: 67 %
Phosphorus: 3 mg/dL (ref 2.8–4.1)
Platelets: 148 x10E3/uL — ABNORMAL LOW (ref 150–450)
Potassium: 4.6 mmol/L (ref 3.5–5.2)
Prostate Specific Ag, Serum: 0.3 ng/mL (ref 0.0–4.0)
RBC: 4.41 x10E6/uL (ref 4.14–5.80)
RDW: 12 % (ref 11.6–15.4)
Sodium: 142 mmol/L (ref 134–144)
T3 Uptake Ratio: 30 % (ref 24–39)
T4, Total: 4 ug/dL — ABNORMAL LOW (ref 4.5–12.0)
TSH: 2.87 u[IU]/mL (ref 0.450–4.500)
Total Protein: 7.4 g/dL (ref 6.0–8.5)
Triglycerides: 191 mg/dL — ABNORMAL HIGH (ref 0–149)
Uric Acid: 6 mg/dL (ref 3.8–8.4)
VLDL Cholesterol Cal: 32 mg/dL (ref 5–40)
WBC: 5.5 x10E3/uL (ref 3.4–10.8)
eGFR: 80 mL/min/1.73 (ref >59)

## 2024-08-15 LAB — HGB A1C W/O EAG: Hgb A1c MFr Bld: 5.6 % (ref 4.8–5.6)

## 2024-08-20 ENCOUNTER — Encounter: Payer: Self-pay | Admitting: Physician Assistant

## 2024-08-21 ENCOUNTER — Ambulatory Visit: Payer: Self-pay | Admitting: Physician Assistant

## 2024-08-21 ENCOUNTER — Encounter: Payer: Self-pay | Admitting: Physician Assistant

## 2024-08-21 VITALS — BP 138/83 | HR 82 | Temp 97.6°F | Resp 16 | Ht 77.0 in | Wt 258.0 lb

## 2024-08-21 DIAGNOSIS — Z0000 Encounter for general adult medical examination without abnormal findings: Secondary | ICD-10-CM

## 2024-08-21 NOTE — Progress Notes (Signed)
 [facility] ____________________________________________   None    (approximate)  I have reviewed the triage vital signs and the nursing notes.   HISTORY  Chief Complaint No chief complaint on file.   HPI Mark Rubio is a 58 y.o. male patient presents for annual physical exam.  Patient voices no concerns or complaints.         Past Medical History:  Diagnosis Date   Hyperlipidemia    Hypothyroidism 07/23/2020   [facility]   Pre-diabetes    [facility]    Patient Active Problem List   Diagnosis Date Noted   Hypothyroidism 07/23/2020   Pre-diabetes 01/10/2020   Ankle weakness 01/10/2020   ED (erectile dysfunction) 01/10/2020   Tubular adenoma 07/02/2019   IFG (impaired fasting glucose) 05/21/2019   Elevated BP without diagnosis of hypertension 05/21/2019   Other hyperlipidemia 05/21/2019   Bright red blood per rectum 05/21/2019   Acute low back pain without sciatica 05/21/2019   Class 1 obesity due to excess calories without serious comorbidity with body mass index (BMI) of 31.0 to 31.9 in adult 05/21/2019   Uses alcohol moderately 05/21/2019   Rupture of left patellar tendon 02/14/2016    Past Surgical History:  Procedure Laterality Date   PATELLAR TENDON REPAIR Left 01/01/2016   Procedure: PATELLA TENDON REPAIR;  Surgeon: Lynwood SHAUNNA Hue, MD;  Location: [facility] ORS;  Service: Orthopedics;  Laterality: Left;    Prior to Admission medications   Medication Sig Start Date End Date Taking? Authorizing Provider  naproxen  (NAPROSYN ) 500 MG tablet Take 1 tablet (500 mg total) by mouth 2 (two) times daily with a meal. 01/17/24   Claudene Tanda POUR, PA-C  rosuvastatin  (CRESTOR ) 40 MG tablet Take 1 tablet (40 mg total) by mouth daily. 05/08/24   Claudene Tanda POUR, PA-C  tadalafil  (CIALIS ) 10 MG tablet Take 1 tablet (10 mg total) by mouth every other day as needed for erectile dysfunction. 05/30/24   Claudene Tanda POUR, PA-C     Allergies Patient has no known allergies.  Family History  Problem Relation Age of Onset   Hypertension Mother    Diabetes Mother    Lung cancer Father     Social History Social History   Tobacco Use   Smoking status: Never   Smokeless tobacco: Never   Tobacco comments:    no passive smokers in home  Substance Use Topics   Alcohol use: Yes    Comment: beer occasionally    Review of Systems Constitutional: No fever/chills Eyes: No visual changes. ENT: No sore throat. Cardiovascular: Denies chest pain. Respiratory: Denies shortness of breath. Gastrointestinal: No abdominal pain.  No nausea, no vomiting.  No diarrhea.  No constipation. Genitourinary: Negative for dysuria. Musculoskeletal: Negative for back pain. Skin: Negative for rash. Neurological: Negative for headaches, focal weakness or numbness.  Endocrine: Hypertension, hypothyroidism, and prediabetes. ____________________________________________   PHYSICAL EXAM:  VITAL SIGNS: BP 138/83  Cuff Size Large  Pulse Rate 82  Temp 97.6 F (36.4 C)  Temp Source Temporal  Weight 258 lb (117 kg)  Height 6' 5 (1.956 m)  Resp 16  SpO2 98 %   BMI: 30.59 kg/m2  BSA: 2.52 m2   Constitutional: Alert and oriented. Well appearing and in no acute distress. Eyes: Conjunctivae are normal. PERRL. EOMI. Head: Atraumatic. Nose: No congestion/rhinnorhea. Mouth/Throat: Mucous membranes are moist.  Oropharynx non-erythematous. Neck: No stridor.  No cervical spine tenderness to palpation. Hematological/Lymphatic/Immunilogical: No cervical lymphadenopathy.  Cardiovascular: Normal rate, regular rhythm. Grossly normal heart sounds.  Good peripheral circulation. Respiratory: Normal respiratory effort.  No retractions. Lungs CTAB. Gastrointestinal: Soft and nontender. No distention. No abdominal bruits. No CVA tenderness. Genitourinary: Deferred Musculoskeletal: No lower extremity tenderness nor edema.  No joint  effusions. Neurologic:  Normal speech and language. No gross focal neurologic deficits are appreciated. No gait instability. Skin:  Skin is warm, dry and intact. No rash noted. Psychiatric: Mood and affect are normal. Speech and behavior are normal.  ____________________________________________   LABS          Component Ref Range & Units (hover) 7 d ago (08/14/24) 1 yr ago (08/11/23) 2 yr ago (07/18/22) 3 yr ago (08/06/21) 3 yr ago (12/22/20) 4 yr ago (01/13/20)  Color, UA yellow Dark Yellow dark yellow Amber yellow Dark Yellow  Clarity, UA clear Clear clear Clear clear Clear  Glucose, UA Negative Negative Negative Negative Negative Negative  Bilirubin, UA neg Negative negative Negative negative Negative  Ketones, UA neg Negative negative Negative negative Negative  Spec Grav, UA 1.015 1.025 1.020 >=1.030 Abnormal  1.025 >=1.030 Abnormal   Blood, UA neg Negative negative Negative negative Negative  pH, UA 5.0 6.0 6.0 6.0 6.0 5.5  Protein, UA Negative Negative Negative Negative Negative Negative  Urobilinogen, UA 0.2 0.2 0.2 0.2 0.2 0.2  Nitrite, UA neg Negative negative Negative negative Negative  Leukocytes, UA Negative Negative Negative Negative Negative Negative  Appearance     medium   Odor                     Component Ref Range & Units (hover) 7 d ago (08/14/24) 1 yr ago (08/11/23) 2 yr ago (07/18/22) 3 yr ago (08/06/21) 3 yr ago (12/22/20) 4 yr ago (01/13/20) 5 yr ago (06/11/19)  Glucose 116 High  117 High  114 High  111 High  CM 121 High  R 117 High  R 103 High  R  Uric Acid 6.0 6.6 CM 5.5 CM 6.2 CM 6.2 CM 6.8 CM   Comment:            Therapeutic target for gout patients: <6.0  BUN 16 15 13 15 12 14 11   Creatinine, Ser 1.07 1.19 1.10 1.03 1.13 1.31 High  1.22  eGFR 80 71 78 85 77 CM    BUN/Creatinine Ratio 15 13 12 15 11 11 9   Sodium 142 140 141 143 142 142 141  Potassium 4.6 4.3 4.1 4.4 4.6 4.2 4.5  Chloride 104 100 106 105 105 105 106  Calcium  9.4 9.6 9.1 9.3  9.2 9.4 9.3  Phosphorus 3.0 3.3 2.9 3.2 3.1 3.2   Total Protein 7.4 7.7 7.2 7.3 7.2 7.3   Albumin 4.6 4.9 4.5 4.8 4.6 4.8   Globulin, Total 2.8 2.8 2.7 2.5 2.6 2.5   Bilirubin Total 1.1 1.3 High  1.0 1.1 1.0 1.3 High    Alkaline Phosphatase 73 76 R 70 R 74 R 78 R 81 R   LDH 196 169 137 146 145 149   AST 22 22 19 18 21 18    ALT 18 18 18 17 19 19    GGT 33 34 32 31 38 33   Iron 53 90 99 91 88 126   Cholesterol, Total 178 155 157 177 187 170   Triglycerides 191 High  141 123 192 High  232 High  194 High    HDL 53 57 59 46 44 46   VLDL Cholesterol  Cal 32 24 22 33 40 33   LDL Chol Calc (NIH) 93 74 76 98 103 High  91   Chol/HDL Ratio 3.4 2.7 CM 2.7 CM 3.8 CM 4.3 CM 3.7 CM   Comment:                                   T. Chol/HDL Ratio                                             Men  Women                               1/2 Avg.Risk  3.4    3.3                                   Avg.Risk  5.0    4.4                                2X Avg.Risk  9.6    7.1                                3X Avg.Risk 23.4   11.0  Estimated CHD Risk 0.5  < 0.5 CM  < 0.5 CM 0.7 CM 0.8 CM 0.6 CM   Comment: The CHD Risk is based on the T. Chol/HDL ratio. Other factors affect CHD Risk such as hypertension, smoking, diabetes, severe obesity, and family history of premature CHD.  TSH 2.870 2.310 3.410 2.340 2.800 2.990   T4, Total 4.0 Low  4.4 Low  4.3 Low  3.9 Low  4.3 Low  3.8 Low    T3 Uptake Ratio 30 31 29 30 26 29    Free Thyroxine Index 1.2 1.4 1.2 1.2 1.1 Low  1.1 Low    Prostate Specific Ag, Serum 0.3 0.4 CM 0.4 CM 0.3 CM 0.3 CM 0.6 CM   Comment: Roche ECLIA methodology. According to the American Urological Association, Serum PSA should decrease and remain at undetectable levels after radical prostatectomy. The AUA defines biochemical recurrence as an initial PSA value 0.2 ng/mL or greater followed by a subsequent confirmatory PSA value 0.2 ng/mL or greater. Values obtained with different assay methods or  kits cannot be used interchangeably. Results cannot be interpreted as absolute evidence of the presence or absence of malignant disease.  WBC 5.5 3.5 3.5 3.5 3.8 3.7   RBC 4.41 4.59 4.55 4.58 4.56 4.67   Hemoglobin 13.2 13.5 13.1 13.3 13.3 13.4   Hematocrit 39.4 41.4 39.7 39.4 39.2 40.0   MCV 89 90 87 86 86 86   MCH 29.9 29.4 28.8 29.0 29.2 28.7   MCHC 33.5 32.6 33.0 33.8 33.9 33.5   RDW 12.0 12.0 12.3 12.4 12.3 12.2   Platelets 148 Low  174 144 Low  147 Low  158 146 Low    Neutrophils 67 55 58 50 54 53   Lymphs 22 30 30  35 29 33   Monocytes 9 13 11 14 15 12    Eos 1 1 1  1  1 1   Basos 0 1 0 0 0 1   Neutrophils Absolute 3.7 1.9 2.0 1.7 2.1 1.9   Lymphocytes Absolute 1.2 1.1 1.1 1.2 1.1 1.2   Monocytes Absolute 0.5 0.4 0.4 0.5 0.6 0.5   EOS (ABSOLUTE) 0.0 0.1 0.0 0.0 0.0 0.0   Basophils Absolute 0.0 0.0 0.0 0.0 0.0 0.0   Immature Granulocytes 0 0 0 0 1 0   Immature Grans (Abs) 0.0 0.0 0.0 0.0 0.0 0.0   Resulting Agency [facility]        Narrative Performed byBETHA MEMOS Performed at:  [address] - [facility] [address], [place], [place]  [id number] Lab Director: Frankey Sas MD, Phone:  [phone]  Specimen Collected: 08/14/24 08:44 Last Resulted: 08/15/24 08:12      Lab Flowsheet       Order Details       View Encounter       Lab and Collection Details       Routing       Result History     View All Conversations on this Encounter    CM=Additional comments  R=Reference range differs from most recent result in table    Result Care Coordination   Patient Communication   Add MyChart Message   Add Notifications  Back to Top     Hgb A1c w/o eAG Order: [id number]  Status: Final result      Next appt: 11/19/2024 at 08:15 AM in [facility] ([facility] NURSE)      Dx: Routine adult health maintenance      Test Result Released: Yes (not seen)    0 Result Notes            Component Ref Range & Units (hover)  7 d ago (08/14/24) 1 yr ago (08/11/23) 2 yr ago (07/18/22) 3 yr ago (08/06/21) 3 yr ago (12/22/20) 4 yr ago (01/13/20) 5 yr ago (06/11/19)  Hgb A1c MFr Bld 5.6 5.7 High  CM 5.5 CM 5.5 CM 5.6 CM 5.5 CM 5.4 CM  Comment:          Prediabetes: 5.7 - 6.4          Diabetes: >6.4          Glycemic control for adults with diabetes: <7.0                   ____________________________________________  EKG  Sinus rhythm at 72 bpm ____________________________________________    ____________________________________________   INITIAL IMPRESSION / ASSESSMENT AND PLAN  As part of my medical decision making, I reviewed the following data within the electronic MEDICAL RECORD NUMBER       No acute findings on physical exam and EKG.  Patient triglycerides continue to be elevated.  Will retest in 3 months.     ____________________________________________   FINAL CLINICAL IMPRESSION  Well exam   ED Discharge Orders     None        Note:  This document was prepared using Dragon voice recognition software and may include unintentional dictation errors.

## 2024-08-21 NOTE — Progress Notes (Signed)
 Pt presents today to complete physical, pt didn't voice any concerns at this time. Cl,RMA

## 2024-08-26 ENCOUNTER — Ambulatory Visit: Payer: Self-pay | Admitting: Physician Assistant

## 2024-08-26 ENCOUNTER — Encounter: Payer: Self-pay | Admitting: Physician Assistant

## 2024-08-26 VITALS — BP 153/88 | HR 83 | Temp 97.3°F | Resp 16

## 2024-08-26 DIAGNOSIS — S6391XA Sprain of unspecified part of right wrist and hand, initial encounter: Secondary | ICD-10-CM

## 2024-08-26 NOTE — Progress Notes (Signed)
 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  Consulted with Safety for ergonomic study.  Skin intact to left palm.  He reportedly has not tried NSAID and samples given.

## 2024-08-26 NOTE — Progress Notes (Signed)
   Subjective: Right hand pain    Patient ID: Mark Rubio, male    DOB: 1966-08-30, 58 y.o.   MRN: [id number]  HPI Patient complaining of right hand pain localized to the palmar aspect of the first metacarpal.  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  Denies loss sensation or loss of function.  Patient is right-hand dominant.   Review of Systems Hyperlipidemia    Objective:   Physical Exam No acute distress. Patient is right-hand dominant.  No obvious ecchymosis, edema, or erythema to the palmar aspect of the right hand.  Neurovascular intact with free and equal range of motion.  Grip strength is 5/5 in comparison to the left nondominant hand.      Assessment & Plan: Strain right hand   Patient given over-the-counter naproxen .  A consult was safety and ergonomic study will be initiated.  Patient will follow-up with no improvement or worsening of complaint.

## 2024-11-07 ENCOUNTER — Other Ambulatory Visit: Payer: Self-pay

## 2024-11-07 DIAGNOSIS — E7849 Other hyperlipidemia: Secondary | ICD-10-CM

## 2024-11-07 MED ORDER — ROSUVASTATIN CALCIUM 40 MG PO TABS: 40.0000 mg | ORAL_TABLET | Freq: Every day | ORAL | 3 refills | Status: AC

## 2024-11-19 ENCOUNTER — Other Ambulatory Visit: Payer: Self-pay

## 2024-11-25 ENCOUNTER — Other Ambulatory Visit: Payer: Self-pay

## 2024-11-28 ENCOUNTER — Other Ambulatory Visit: Payer: Self-pay

## 2024-12-03 ENCOUNTER — Other Ambulatory Visit: Payer: Self-pay
